# Patient Record
Sex: Female | Born: 1990 | Race: White | Hispanic: No | Marital: Married | Smoking: Never smoker
Health system: Southern US, Community
[De-identification: ages and names within clinical notes are randomized; demographics above are authoritative.]

## PROBLEM LIST (undated history)

## (undated) DIAGNOSIS — R002 Palpitations: Secondary | ICD-10-CM

## (undated) HISTORY — DX: Palpitations: R00.2

## (undated) HISTORY — PX: CHOLECYSTECTOMY: SHX55

---

## 2013-02-16 ENCOUNTER — Ambulatory Visit (INDEPENDENT_AMBULATORY_CARE_PROVIDER_SITE_OTHER): Payer: PRIVATE HEALTH INSURANCE | Admitting: General Practice

## 2013-02-16 ENCOUNTER — Encounter: Payer: Self-pay | Admitting: General Practice

## 2013-02-16 VITALS — BP 130/80 | HR 61 | Temp 98.7°F | Ht 63.75 in | Wt 182.0 lb

## 2013-02-16 DIAGNOSIS — N390 Urinary tract infection, site not specified: Secondary | ICD-10-CM

## 2013-02-16 DIAGNOSIS — M549 Dorsalgia, unspecified: Secondary | ICD-10-CM

## 2013-02-16 DIAGNOSIS — R109 Unspecified abdominal pain: Secondary | ICD-10-CM

## 2013-02-16 LAB — POCT UA - MICROSCOPIC ONLY: Casts, Ur, LPF, POC: NEGATIVE

## 2013-02-16 LAB — POCT URINALYSIS DIPSTICK
Bilirubin, UA: NEGATIVE
Glucose, UA: NEGATIVE
Nitrite, UA: NEGATIVE
Urobilinogen, UA: NEGATIVE

## 2013-02-16 LAB — POCT URINE PREGNANCY: Preg Test, Ur: NEGATIVE

## 2013-02-16 MED ORDER — CIPROFLOXACIN HCL 500 MG PO TABS
500.0000 mg | ORAL_TABLET | Freq: Two times a day (BID) | ORAL | Status: DC
Start: 2013-02-16 — End: 2013-08-16

## 2013-02-16 MED ORDER — IBUPROFEN 600 MG PO TABS: 600.0000 mg | ORAL_TABLET | Freq: Three times a day (TID) | ORAL | Status: DC | PRN

## 2013-02-16 NOTE — Progress Notes (Signed)
Subjective:    Patient ID: Erin Figueroa, female    DOB: [birth year], 22 y.o.   MRN: [id number]  Back Pain This is a new problem. The current episode started more than 1 month ago. The problem occurs intermittently. The problem has been rapidly worsening since onset. Pain location: entire back, usually starts upper back. The quality of the pain is described as aching. The pain does not radiate. The pain is at a severity of 0/10 (when having pain, rates as 10). The pain is worse during the night. Exacerbated by: walking. Pertinent negatives include no abdominal pain, bladder incontinence, bowel incontinence, chest pain, dysuria, fever, headaches, leg pain, numbness, pelvic pain or weakness. Risk factors include recent trauma. She has tried ice for the symptoms.   Reports she has an infant that is 82 months old. She reports back pain began one month after having baby. She had a c-section and reports following up with her OB. She reports they suspected a pulled muscle. Patient has not taken any muscle relaxers. She reports taking ibuprofen OTC with some relief. Reports last menstrual cycle beginning early May (unsure of date) and taking birth control as prescribed. Denies being pregnant.     Review of Systems  Constitutional: Negative for fever and chills.  HENT: Negative for neck pain and neck stiffness.   Respiratory: Negative for chest tightness and shortness of breath.   Cardiovascular: Negative for chest pain and palpitations.  Gastrointestinal: Negative for abdominal pain, blood in stool and bowel incontinence.       Abdominal pain occurs after back pain begins. Currently not experiencing abdominal pain.   Genitourinary: Negative for bladder incontinence, dysuria, vaginal bleeding and pelvic pain.  Musculoskeletal: Negative for back pain.       Currently not experiencing back pain  Neurological: Negative for weakness, numbness and headaches.       Objective:   Physical Exam   Constitutional: She is oriented to person, place, and time. She appears well-developed and well-nourished.  HENT:  Head: Normocephalic and atraumatic.  Cardiovascular: Normal rate, regular rhythm and normal heart sounds.   Pulmonary/Chest: Effort normal and breath sounds normal. No respiratory distress. She exhibits no tenderness.  Abdominal: Soft. Bowel sounds are normal. She exhibits no distension. There is no tenderness. There is no rebound and no guarding.  Neurological: She is alert and oriented to person, place, and time.  Skin: Skin is warm and dry.  Psychiatric: She has a normal mood and affect.   Results for orders placed in visit on 02/16/13  POCT URINE PREGNANCY      Result Value Range   Preg Test, Ur Negative    POCT URINALYSIS DIPSTICK      Result Value Range   Color, UA gold     Clarity, UA clear     Glucose, UA negative     Bilirubin, UA negative     Ketones, UA negative     Spec Grav, UA 1.025     Blood, UA moderate     pH, UA 6.0     Protein, UA 4+     Urobilinogen, UA negative     Nitrite, UA negative     Leukocytes, UA Trace    POCT UA - MICROSCOPIC ONLY      Result Value Range   WBC, Ur, HPF, POC 5-10     RBC, urine, microscopic 5-8     Bacteria, U Microscopic moderate     Mucus, UA negative     Epithelial  cells, urine per micros few     Crystals, Ur, HPF, POC negative     Casts, Ur, LPF, POC negative     Yeast, UA negative           Assessment & Plan:  1. Back pain and 2. Abdominal pain, unspecified site - POCT urine pregnancy - POCT urinalysis dipstick - POCT UA - Microscopic Only  3. UTI (urinary tract infection) - ciprofloxacin (CIPRO) 500 MG tablet; Take 1 tablet (500 mg total) by mouth 2 (two) times daily.  Dispense: 20 tablet; Refill: 0 -Increase fluid intake AZO over the counter X2 days Frequent voiding Proper perineal hygiene RTO if symptoms worsen or unresolved and in 2 weeks for recheck Use back up contraception (condoms) when  taking antibiotics Patient verbalized understanding Coralie Keens, FNP-C

## 2013-02-16 NOTE — Patient Instructions (Addendum)
Back Pain, Adult Low back pain is very common. About 1 in 5 people have back pain.The cause of low back pain is rarely dangerous. The pain often gets better over time.About half of people with a sudden onset of back pain feel better in just 2 weeks. About 8 in 10 people feel better by 6 weeks.  CAUSES Some common causes of back pain include:  Strain of the muscles or ligaments supporting the spine.  Wear and tear (degeneration) of the spinal discs.  Arthritis.  Direct injury to the back. DIAGNOSIS Most of the time, the direct cause of low back pain is not known.However, back pain can be treated effectively even when the exact cause of the pain is unknown.Answering your caregiver's questions about your overall health and symptoms is one of the most accurate ways to make sure the cause of your pain is not dangerous. If your caregiver needs more information, he or she may order lab work or imaging tests (X-rays or MRIs).However, even if imaging tests show changes in your back, this usually does not require surgery. HOME CARE INSTRUCTIONS For many people, back pain returns.Since low back pain is rarely dangerous, it is often a condition that people can learn to manageon their own.   Remain active. It is stressful on the back to sit or stand in one place. Do not sit, drive, or stand in one place for more than 30 minutes at a time. Take short walks on level surfaces as soon as pain allows.Try to increase the length of time you walk each day.  Do not stay in bed.Resting more than 1 or 2 days can delay your recovery.  Do not avoid exercise or work.Your body is made to move.It is not dangerous to be active, even though your back may hurt.Your back will likely heal faster if you return to being active before your pain is gone.  Pay attention to your body when you bend and lift. Many people have less discomfortwhen lifting if they bend their knees, keep the load close to their bodies,and  avoid twisting. Often, the most comfortable positions are those that put less stress on your recovering back.  Find a comfortable position to sleep. Use a firm mattress and lie on your side with your knees slightly bent. If you lie on your back, put a pillow under your knees.  Only take over-the-counter or prescription medicines as directed by your caregiver. Over-the-counter medicines to reduce pain and inflammation are often the most helpful.Your caregiver may prescribe muscle relaxant drugs.These medicines help dull your pain so you can more quickly return to your normal activities and healthy exercise.  Put ice on the injured area.  Put ice in a plastic bag.  Place a towel between your skin and the bag.  Leave the ice on for 15-20 minutes, 3-4 times a day for the first 2 to 3 days. After that, ice and heat may be alternated to reduce pain and spasms.  Ask your caregiver about trying back exercises and gentle massage. This may be of some benefit.  Avoid feeling anxious or stressed.Stress increases muscle tension and can worsen back pain.It is important to recognize when you are anxious or stressed and learn ways to manage it.Exercise is a great option. SEEK MEDICAL CARE IF:  You have pain that is not relieved with rest or medicine.  You have pain that does not improve in 1 week.  You have new symptoms.  You are generally not feeling well. SEEK   medicine.   You have pain that does not improve in 1 week.   You have new symptoms.   You are generally not feeling well.  SEEK IMMEDIATE MEDICAL CARE IF:    You have pain that radiates from your back into your legs.   You develop new bowel or bladder control problems.   You have unusual weakness or numbness in your arms or legs.   You develop nausea or vomiting.   You develop abdominal pain.   You feel faint.  Document Released: 08/25/2005 Document Revised: 02/24/2012 Document Reviewed: 01/13/2011  [employer] Patient Information 2014 [employer]  Urinary Tract Infection  Urinary tract infections (UTIs) can develop anywhere along your urinary tract. Your  urinary tract is your body's drainage system for removing wastes and extra water. Your urinary tract includes two kidneys, two ureters, a bladder, and a urethra. Your kidneys are a pair of bean-shaped organs. Each kidney is about the size of your fist. They are located below your ribs, one on each side of your spine.  CAUSES  Infections are caused by microbes, which are microscopic organisms, including fungi, viruses, and bacteria. These organisms are so small that they can only be seen through a microscope. Bacteria are the microbes that most commonly cause UTIs.  SYMPTOMS   Symptoms of UTIs may vary by age and gender of the patient and by the location of the infection. Symptoms in young women typically include a frequent and intense urge to urinate and a painful, burning feeling in the bladder or urethra during urination. Older women and men are more likely to be tired, shaky, and weak and have muscle aches and abdominal pain. A fever may mean the infection is in your kidneys. Other symptoms of a kidney infection include pain in your back or sides below the ribs, nausea, and vomiting.  DIAGNOSIS  To diagnose a UTI, your caregiver will ask you about your symptoms. Your caregiver also will ask to provide a urine sample. The urine sample will be tested for bacteria and white blood cells. White blood cells are made by your body to help fight infection.  TREATMENT   Typically, UTIs can be treated with medication. Because most UTIs are caused by a bacterial infection, they usually can be treated with the use of antibiotics. The choice of antibiotic and length of treatment depend on your symptoms and the type of bacteria causing your infection.  HOME CARE INSTRUCTIONS   If you were prescribed antibiotics, take them exactly as your caregiver instructs you. Finish the medication even if you feel better after you have only taken some of the medication.   Drink enough water and fluids to keep your urine clear or pale  yellow.   Avoid caffeine, tea, and carbonated beverages. They tend to irritate your bladder.   Empty your bladder often. Avoid holding urine for long periods of time.   Empty your bladder before and after sexual intercourse.   After a bowel movement, women should cleanse from front to back. Use each tissue only once.  SEEK MEDICAL CARE IF:    You have back pain.   You develop a fever.   Your symptoms do not begin to resolve within 3 days.  SEEK IMMEDIATE MEDICAL CARE IF:    You have severe back pain or lower abdominal pain.   You develop chills.   You have nausea or vomiting.   You have continued burning or discomfort with urination.  MAKE SURE YOU:   

## 2013-03-02 ENCOUNTER — Ambulatory Visit: Payer: PRIVATE HEALTH INSURANCE | Admitting: General Practice

## 2013-08-16 ENCOUNTER — Encounter: Payer: Self-pay | Admitting: Family Medicine

## 2013-08-16 ENCOUNTER — Ambulatory Visit (INDEPENDENT_AMBULATORY_CARE_PROVIDER_SITE_OTHER): Payer: PRIVATE HEALTH INSURANCE | Admitting: Family Medicine

## 2013-08-16 VITALS — BP 121/80 | HR 122 | Temp 99.4°F | Ht 63.75 in | Wt 189.8 lb

## 2013-08-16 DIAGNOSIS — N390 Urinary tract infection, site not specified: Secondary | ICD-10-CM

## 2013-08-16 LAB — POCT URINALYSIS DIPSTICK
Bilirubin, UA: NEGATIVE
Glucose, UA: NEGATIVE
Leukocytes, UA: NEGATIVE
Nitrite, UA: NEGATIVE
Protein, UA: 200
Spec Grav, UA: >=1.03
Urobilinogen, UA: NEGATIVE
pH, UA: 5

## 2013-08-16 LAB — POCT UA - MICROSCOPIC ONLY
Casts, Ur, LPF, POC: NEGATIVE
Crystals, Ur, HPF, POC: NEGATIVE
Yeast, UA: NEGATIVE

## 2013-08-16 MED ORDER — ONDANSETRON HCL 8 MG PO TABS: 8.0000 mg | ORAL_TABLET | Freq: Three times a day (TID) | ORAL | Status: DC | PRN

## 2013-08-16 MED ORDER — CIPROFLOXACIN HCL 500 MG PO TABS: 500.0000 mg | ORAL_TABLET | Freq: Two times a day (BID) | ORAL | Status: DC

## 2013-08-16 NOTE — Patient Instructions (Signed)

## 2013-08-16 NOTE — Progress Notes (Signed)
   Subjective:    Patient ID: Erin Figueroa, female    DOB: [birth year], 22 y.o.   MRN: [id number]  HPI This 22 y.o. female presents for evaluation of dysuria for 3 days.  She has been feeling nauseated..   Review of Systems C/o nausea and dysuria. No chest pain, SOB, HA, dizziness, vision change, diarrhea, constipation,  myalgias, arthralgias or rash.     Objective:   Physical Exam Vital signs noted  Well developed well nourished female.  HEENT - Head atraumatic Normocephalic                Eyes - PERRLA, Conjuctiva - clear Sclera- Clear EOMI                Ears - EAC's Wnl TM's Wnl Gross Hearing WNL                Throat - oropharanx wnl Respiratory - Lungs CTA bilateral Cardiac - RRR S1 and S2 without murmur GI - Abdomen soft Nontender and bowel sounds active x 4 Extremities - No edema. Neuro - Grossly intact.       Assessment & Plan:  UTI (urinary tract infection) - Plan: POCT UA - Microscopic Only, POCT urinalysis dipstick, ciprofloxacin (CIPRO) 500 MG tablet, ondansetron (ZOFRAN) 8 MG tablet  Deatra Canter FNP

## 2013-12-24 ENCOUNTER — Other Ambulatory Visit: Payer: Self-pay | Admitting: Family Medicine

## 2013-12-24 ENCOUNTER — Ambulatory Visit (INDEPENDENT_AMBULATORY_CARE_PROVIDER_SITE_OTHER): Payer: PRIVATE HEALTH INSURANCE | Admitting: Family Medicine

## 2013-12-24 VITALS — BP 131/79 | HR 76 | Temp 97.7°F

## 2013-12-24 DIAGNOSIS — N390 Urinary tract infection, site not specified: Secondary | ICD-10-CM

## 2013-12-24 DIAGNOSIS — R300 Dysuria: Secondary | ICD-10-CM

## 2013-12-24 LAB — POCT UA - MICROSCOPIC ONLY

## 2013-12-24 LAB — POCT URINALYSIS DIPSTICK
BILIRUBIN UA: NEGATIVE
Glucose, UA: NEGATIVE
Ketones, UA: NEGATIVE
LEUKOCYTES UA: NEGATIVE
NITRITE UA: NEGATIVE
Spec Grav, UA: >=1.03
UROBILINOGEN UA: 0.2
pH, UA: 6.5

## 2013-12-24 MED ORDER — CIPROFLOXACIN HCL 500 MG PO TABS: 500.0000 mg | ORAL_TABLET | Freq: Two times a day (BID) | ORAL | Status: DC

## 2013-12-24 MED ORDER — PHENAZOPYRIDINE HCL 200 MG PO TABS: 200.0000 mg | ORAL_TABLET | Freq: Three times a day (TID) | ORAL | Status: DC | PRN

## 2013-12-24 NOTE — Patient Instructions (Signed)
Drink plenty of fluids Continue to take Cipro 500 twice daily for at least 7 days If culture and sensitivity indicates that another antibiotic is needed we will let you know when that result returns Take Pyridium for discomfort with voiding

## 2013-12-24 NOTE — Addendum Note (Signed)
Addended by: Bernita BuffyANDERSON, Velda Wendt G on: 12/24/2013 09:24 AM   Modules accepted: Orders

## 2013-12-24 NOTE — Progress Notes (Signed)
Subjective:    Patient ID: Erin Figueroa, female    DOB: 03/25/1991, 23 y.o.   MRN: [id number]  Hematuria Irritative symptoms do not include frequency or urgency. Associated symptoms include abdominal pain, dysuria and nausea. Pertinent negatives include no flank pain or vomiting.    Patient is here for burning with urination since last night. Also saw some blood in her urine. Took 1 Cipro last night that she had left over from an old rx. The patient indicates that all of her she has had urinary tract infections and she has never seen a specialist. We will arrange for her to see a urologist for further evaluation of these recurrent urinary tract infections.  Review of Systems  Constitutional: Negative.   HENT: Negative.   Eyes: Negative.   Respiratory: Negative.   Cardiovascular: Negative.   Gastrointestinal: Positive for nausea and abdominal pain. Negative for vomiting, diarrhea, constipation, blood in stool, abdominal distention, anal bleeding and rectal pain.  Endocrine: Negative.   Genitourinary: Positive for dysuria, hematuria and difficulty urinating. Negative for urgency, frequency, flank pain, decreased urine volume, vaginal bleeding, vaginal discharge, enuresis, genital sores, vaginal pain, menstrual problem, pelvic pain and dyspareunia.  Musculoskeletal: Positive for back pain. Negative for arthralgias, gait problem, joint swelling, myalgias, neck pain and neck stiffness.  Skin: Negative.   Allergic/Immunologic: Negative.   Neurological: Negative.   Hematological: Negative.   Psychiatric/Behavioral: Negative.        Objective:   Physical Exam  Nursing note and vitals reviewed. Constitutional: She is oriented to person, place, and time. She appears well-developed and well-nourished. No distress.  HENT:  Head: Normocephalic.  Eyes: Conjunctivae and EOM are normal. Pupils are equal, round, and reactive to light. Right eye exhibits no discharge. Left eye exhibits no  discharge. No scleral icterus.  Neck: Normal range of motion.  Pulmonary/Chest: Breath sounds normal.  Abdominal: Soft. Bowel sounds are normal. She exhibits no distension and no mass. There is tenderness. There is no rebound and no guarding.  Slight suprapubic tenderness  Musculoskeletal: Normal range of motion. She exhibits no edema.  Lymphadenopathy:    She has no cervical adenopathy.  Neurological: She is alert and oriented to person, place, and time.  Skin: Skin is warm and dry. No rash noted.  Psychiatric: She has a normal mood and affect. Her behavior is normal. Judgment and thought content normal.   The urinalysis had 16-40 WBCs and many RBCs and a lot of bacteria.  BP 131/79  Pulse 76  Temp(Src) 97.7 F (36.5 C) (Oral)     Assessment & Plan:   1. Dysuria - POCT UA - Microscopic Only - POCT urinalysis dipstick - phenazopyridine (PYRIDIUM) 200 MG tablet; Take 1 tablet (200 mg total) by mouth 3 (three) times daily as needed for pain.  Dispense: 10 tablet; Refill: 0  2. UTI (urinary tract infection) - ciprofloxacin (CIPRO) 500 MG tablet; Take 1 tablet (500 mg total) by mouth 2 (two) times daily.  Dispense: 20 tablet; Refill: 0 - phenazopyridine (PYRIDIUM) 200 MG tablet; Take 1 tablet (200 mg total) by mouth 3 (three) times daily as needed for pain.  Dispense: 10 tablet; Refill: 0  3. Recurrent UTI -Referral to urology  Patient Instructions  Drink plenty of fluids Continue to take Cipro 500 twice daily for at least 7 days If culture and sensitivity indicates that another antibiotic is needed we will let you know when that result returns Take Pyridium for discomfort with voiding   Nyra Capeson W. Feras Gardella  MD

## 2013-12-26 ENCOUNTER — Other Ambulatory Visit: Payer: Self-pay | Admitting: Family Medicine

## 2013-12-26 DIAGNOSIS — N390 Urinary tract infection, site not specified: Secondary | ICD-10-CM

## 2013-12-28 LAB — SPECIMEN STATUS REPORT

## 2013-12-28 LAB — URINE CULTURE

## 2014-05-16 ENCOUNTER — Telehealth: Payer: Self-pay | Admitting: Family Medicine

## 2014-05-16 NOTE — Telephone Encounter (Signed)
Appt given for Thursday am per patient request. Offered appt for today but patient refused

## 2014-05-18 ENCOUNTER — Ambulatory Visit (INDEPENDENT_AMBULATORY_CARE_PROVIDER_SITE_OTHER): Payer: PRIVATE HEALTH INSURANCE

## 2014-05-18 ENCOUNTER — Encounter: Payer: Self-pay | Admitting: Family Medicine

## 2014-05-18 ENCOUNTER — Ambulatory Visit (INDEPENDENT_AMBULATORY_CARE_PROVIDER_SITE_OTHER): Payer: PRIVATE HEALTH INSURANCE | Admitting: Family Medicine

## 2014-05-18 VITALS — BP 107/74 | HR 72 | Temp 98.3°F | Ht 63.75 in | Wt 211.0 lb

## 2014-05-18 DIAGNOSIS — N912 Amenorrhea, unspecified: Secondary | ICD-10-CM

## 2014-05-18 DIAGNOSIS — M79609 Pain in unspecified limb: Secondary | ICD-10-CM

## 2014-05-18 DIAGNOSIS — L02818 Cutaneous abscess of other sites: Secondary | ICD-10-CM

## 2014-05-18 DIAGNOSIS — K141 Geographic tongue: Secondary | ICD-10-CM

## 2014-05-18 DIAGNOSIS — L03818 Cellulitis of other sites: Secondary | ICD-10-CM

## 2014-05-18 DIAGNOSIS — M79671 Pain in right foot: Secondary | ICD-10-CM

## 2014-05-18 LAB — POCT URINE PREGNANCY: Preg Test, Ur: NEGATIVE

## 2014-05-18 MED ORDER — NAPROXEN 500 MG PO TABS: 500.0000 mg | ORAL_TABLET | Freq: Two times a day (BID) | ORAL | Status: DC

## 2014-05-18 MED ORDER — MAGIC MOUTHWASH: 5.0000 mL | Freq: Four times a day (QID) | ORAL | Status: DC | PRN

## 2014-05-18 MED ORDER — DOXYCYCLINE HYCLATE 100 MG PO TABS: 100.0000 mg | ORAL_TABLET | Freq: Two times a day (BID) | ORAL | Status: DC

## 2014-05-18 NOTE — Progress Notes (Signed)
   Subjective:    Patient ID: Erin Figueroa, female    DOB: Jun 03, 1991, 23 y.o.   MRN: [id number]  HPI Patient is here with c/o right foot swelling.  She dropped object on right foot 4 weeks ago. She is having persistent swelling.  She has a sore tongue.  She has not had a period for over 2 months   Review of Systems No chest pain, SOB, HA, dizziness, vision change, N/V, diarrhea, constipation, dysuria, urinary urgency or frequency, myalgias, arthralgias or rash.     Objective:   Physical Exam  Vital signs noted  Well developed well nourished female.  HEENT - Head atraumatic Normocephalic                Eyes - PERRLA, Conjuctiva - clear Sclera- Clear EOMI                Ears - EAC's Wnl TM's Wnl Gross Hearing WNL                 Throat - oropharanx with geographic tongue. Respiratory - Lungs CTA bilateral Cardiac - RRR S1 and S2 without murmur GI - Abdomen soft Nontender and bowel sounds active x 4 Extremities - right foot with pedal edema Neuro - Grossly intact. Skin - right first toe erythematous     Assessment & Plan:  Amenorrhea - Plan: POCT urine pregnancy negative and recommend follow up with PCP to discuss.  Right foot pain - Plan: DG Foot Complete Right, doxycycline (VIBRA-TABS) 100 MG tablet, naproxen (NAPROSYN) 500 MG tablet  Glossitis, benign migratory - Plan: Alum & Mag Hydroxide-Simeth (MAGIC MOUTHWASH) SOLN  Cellulitis of other specified site - Plan: doxycycline (VIBRA-TABS) 100 MG tablet  Deatra Canter FNP

## 2014-05-19 ENCOUNTER — Telehealth: Payer: Self-pay | Admitting: Family Medicine

## 2014-05-19 MED ORDER — AMOXICILLIN 875 MG PO TABS: 875.0000 mg | ORAL_TABLET | Freq: Two times a day (BID) | ORAL | Status: DC

## 2014-05-19 NOTE — Telephone Encounter (Signed)
Changed antibiotic to amoxicillin.

## 2014-05-19 NOTE — Telephone Encounter (Signed)
Can you please review 

## 2014-05-22 NOTE — Telephone Encounter (Signed)
Left message that medication had been changed.

## 2014-09-23 ENCOUNTER — Emergency Department (HOSPITAL_COMMUNITY): Payer: PRIVATE HEALTH INSURANCE

## 2014-09-23 ENCOUNTER — Inpatient Hospital Stay (HOSPITAL_COMMUNITY)
Admission: EM | Admit: 2014-09-23 | Discharge: 2014-09-25 | DRG: 417 | Disposition: A | Discharge status: Home / Self Care | Payer: PRIVATE HEALTH INSURANCE | Attending: Surgery | Admitting: Surgery

## 2014-09-23 ENCOUNTER — Encounter (HOSPITAL_COMMUNITY): Payer: Self-pay | Admitting: Emergency Medicine

## 2014-09-23 DIAGNOSIS — R1011 Right upper quadrant pain: Secondary | ICD-10-CM | POA: Diagnosis present

## 2014-09-23 DIAGNOSIS — Z881 Allergy status to other antibiotic agents status: Secondary | ICD-10-CM

## 2014-09-23 DIAGNOSIS — Z87440 Personal history of urinary (tract) infections: Secondary | ICD-10-CM

## 2014-09-23 DIAGNOSIS — Z833 Family history of diabetes mellitus: Secondary | ICD-10-CM | POA: Diagnosis not present

## 2014-09-23 DIAGNOSIS — Z8249 Family history of ischemic heart disease and other diseases of the circulatory system: Secondary | ICD-10-CM

## 2014-09-23 DIAGNOSIS — R748 Abnormal levels of other serum enzymes: Secondary | ICD-10-CM | POA: Diagnosis present

## 2014-09-23 DIAGNOSIS — K8060 Calculus of gallbladder and bile duct with cholecystitis, unspecified, without obstruction: Principal | ICD-10-CM | POA: Diagnosis present

## 2014-09-23 DIAGNOSIS — Z6838 Body mass index (BMI) 38.0-38.9, adult: Secondary | ICD-10-CM | POA: Diagnosis not present

## 2014-09-23 DIAGNOSIS — K859 Acute pancreatitis, unspecified: Secondary | ICD-10-CM | POA: Diagnosis present

## 2014-09-23 DIAGNOSIS — Z79899 Other long term (current) drug therapy: Secondary | ICD-10-CM

## 2014-09-23 DIAGNOSIS — Z791 Long term (current) use of non-steroidal anti-inflammatories (NSAID): Secondary | ICD-10-CM

## 2014-09-23 DIAGNOSIS — K8050 Calculus of bile duct without cholangitis or cholecystitis without obstruction: Secondary | ICD-10-CM

## 2014-09-23 DIAGNOSIS — K8020 Calculus of gallbladder without cholecystitis without obstruction: Secondary | ICD-10-CM

## 2014-09-23 DIAGNOSIS — K829 Disease of gallbladder, unspecified: Secondary | ICD-10-CM | POA: Diagnosis present

## 2014-09-23 LAB — CBC WITH DIFFERENTIAL/PLATELET
BASOS ABS: 0 10*3/uL (ref 0.0–0.1)
BASOS PCT: 0 % (ref 0–1)
EOS ABS: 0.1 10*3/uL (ref 0.0–0.7)
Eosinophils Relative: 1 % (ref 0–5)
HEMATOCRIT: 42.5 % (ref 36.0–46.0)
HEMOGLOBIN: 14.2 g/dL (ref 12.0–15.0)
LYMPHS PCT: 17 % (ref 12–46)
Lymphs Abs: 1.6 10*3/uL (ref 0.7–4.0)
MCH: 28.5 pg (ref 26.0–34.0)
MCHC: 33.4 g/dL (ref 30.0–36.0)
MCV: 85.3 fL (ref 78.0–100.0)
MONOS PCT: 5 % (ref 3–12)
Monocytes Absolute: 0.5 10*3/uL (ref 0.1–1.0)
NEUTROS ABS: 6.8 10*3/uL (ref 1.7–7.7)
NEUTROS PCT: 77 % (ref 43–77)
Platelets: 273 10*3/uL (ref 150–400)
RBC: 4.98 MIL/uL (ref 3.87–5.11)
RDW: 13.1 % (ref 11.5–15.5)
WBC: 9 10*3/uL (ref 4.0–10.5)

## 2014-09-23 LAB — COMPREHENSIVE METABOLIC PANEL
ALBUMIN: 4.6 g/dL (ref 3.5–5.2)
ALT: 360 U/L — ABNORMAL HIGH (ref 0–35)
AST: 179 U/L — AB (ref 0–37)
Alkaline Phosphatase: 224 U/L — ABNORMAL HIGH (ref 39–117)
Anion gap: 6 (ref 5–15)
BILIRUBIN TOTAL: 1 mg/dL (ref 0.3–1.2)
BUN: 14 mg/dL (ref 6–23)
CHLORIDE: 105 meq/L (ref 96–112)
CO2: 25 mmol/L (ref 19–32)
Calcium: 9.2 mg/dL (ref 8.4–10.5)
Creatinine, Ser: 0.76 mg/dL (ref 0.50–1.10)
GFR calc Af Amer: >90 mL/min (ref >90)
GFR calc non Af Amer: >90 mL/min (ref >90)
GLUCOSE: 90 mg/dL (ref 70–99)
POTASSIUM: 3.7 mmol/L (ref 3.5–5.1)
SODIUM: 136 mmol/L (ref 135–145)
Total Protein: 8.4 g/dL — ABNORMAL HIGH (ref 6.0–8.3)

## 2014-09-23 LAB — URINALYSIS, ROUTINE W REFLEX MICROSCOPIC
GLUCOSE, UA: NEGATIVE mg/dL
KETONES UR: NEGATIVE mg/dL
NITRITE: NEGATIVE
PH: 8 (ref 5.0–8.0)
PROTEIN: NEGATIVE mg/dL
Specific Gravity, Urine: 1.022 (ref 1.005–1.030)
Urobilinogen, UA: 1 mg/dL (ref 0.0–1.0)

## 2014-09-23 LAB — URINE MICROSCOPIC-ADD ON

## 2014-09-23 LAB — LIPASE, BLOOD: Lipase: 117 U/L — ABNORMAL HIGH (ref 11–59)

## 2014-09-23 LAB — POC URINE PREG, ED: Preg Test, Ur: NEGATIVE

## 2014-09-23 MED ORDER — ONDANSETRON 8 MG PO TBDP
8.0000 mg | ORAL_TABLET | Freq: Once | ORAL | Status: AC
  Administered 2014-09-23: 8 mg via ORAL
  Filled 2014-09-23: qty 1

## 2014-09-23 MED ORDER — ONDANSETRON HCL 4 MG/2ML IJ SOLN
4.0000 mg | Freq: Four times a day (QID) | INTRAMUSCULAR | Status: DC | PRN
  Administered 2014-09-23: 4 mg via INTRAVENOUS
  Filled 2014-09-23: qty 2

## 2014-09-23 MED ORDER — POTASSIUM CHLORIDE IN NACL 20-0.45 MEQ/L-% IV SOLN
INTRAVENOUS | Status: DC
  Administered 2014-09-23: 150 mL/h via INTRAVENOUS
  Administered 2014-09-24 (×2): via INTRAVENOUS
  Administered 2014-09-24: 150 mL/h via INTRAVENOUS
  Administered 2014-09-25 (×2): via INTRAVENOUS
  Filled 2014-09-23 (×9): qty 1000

## 2014-09-23 MED ORDER — HYDROMORPHONE HCL 1 MG/ML IJ SOLN
0.5000 mg | INTRAMUSCULAR | Status: DC | PRN
  Administered 2014-09-23 (×2): 0.5 mg via INTRAVENOUS
  Filled 2014-09-23 (×2): qty 1

## 2014-09-23 MED ORDER — PROMETHAZINE HCL 25 MG/ML IJ SOLN
25.0000 mg | Freq: Four times a day (QID) | INTRAMUSCULAR | Status: DC | PRN
  Administered 2014-09-23 – 2014-09-25 (×2): 25 mg via INTRAVENOUS
  Filled 2014-09-23 (×2): qty 1

## 2014-09-23 MED ORDER — MORPHINE SULFATE 2 MG/ML IJ SOLN: 1.0000 mg | INTRAMUSCULAR | Status: DC | PRN

## 2014-09-23 MED ORDER — HYDROCODONE-ACETAMINOPHEN 5-325 MG PO TABS
1.0000 | ORAL_TABLET | ORAL | Status: DC | PRN
  Administered 2014-09-23 – 2014-09-25 (×6): 2 via ORAL
  Filled 2014-09-23 (×6): qty 2

## 2014-09-23 MED ORDER — ONDANSETRON HCL 4 MG/2ML IJ SOLN
4.0000 mg | Freq: Once | INTRAMUSCULAR | Status: AC
  Administered 2014-09-23: 4 mg via INTRAVENOUS
  Filled 2014-09-23: qty 2

## 2014-09-23 MED ORDER — SODIUM CHLORIDE 0.9 % IV SOLN
1000.0000 mL | Freq: Once | INTRAVENOUS | Status: AC
  Administered 2014-09-23: 1000 mL via INTRAVENOUS

## 2014-09-23 MED ORDER — SODIUM CHLORIDE 0.9 % IV SOLN
1000.0000 mL | INTRAVENOUS | Status: DC
  Administered 2014-09-23: 1000 mL via INTRAVENOUS

## 2014-09-23 NOTE — ED Notes (Signed)
Pt reports recent UTI but self treated with CIPRO leading to allergic reaction. Pt seen yesterday for allergic reaction and given prednisone.

## 2014-09-23 NOTE — ED Notes (Signed)
Ultrasound at bedside

## 2014-09-23 NOTE — ED Provider Notes (Signed)
CSN: [id number]     Arrival date & time 09/23/14  0915 History   First MD Initiated Contact with Patient 09/23/14 1024     Chief Complaint  Patient presents with  . Flank Pain  . Emesis   HPI Patient presents to the emergency room with complaints of right flank pain. Patient started having trouble Thursday evening. Her mean consisted of chick fil a nuggets and fries. She had a urine sample done in the office that showed blood. This was done by a family member who works in the [employer] office. The patient has had some nausea as well. She had an episode of vomiting this morning and decided to come to the emergency room. The pain is in the right flank. It does not radiate. It is sharp. She has not noticed anything particularly makes it worse or better. No history of problems with gallstones or kidney stones in the past. History reviewed. No pertinent past medical history. Past Surgical History  Procedure Laterality Date  . Cesarean section     Family History  Problem Relation Age of Onset  . Depression Mother   . Diabetes Father   . Hypertension Father    History  Substance Use Topics  . Smoking status: Never Smoker   . Smokeless tobacco: Not on file  . Alcohol Use: No   OB History    No data available     Review of Systems  All other systems reviewed and are negative.     Allergies  Ciprofloxacin and Sulfa antibiotics  Home Medications   Prior to Admission medications   Medication Sig Start Date End Date Taking? Authorizing Provider  ibuprofen (ADVIL,MOTRIN) 200 MG tablet Take 400 mg by mouth every 6 (six) hours as needed for mild pain.   Yes Historical Provider, MD  Alum & Mag Hydroxide-Simeth (MAGIC MOUTHWASH) SOLN Take 5 mLs by mouth 4 (four) times daily as needed for mouth pain. Patient not taking: Reported on 09/23/2014 05/18/14   Deatra CanterWilliam J Oxford, FNP  amoxicillin (AMOXIL) 875 MG tablet Take 1 tablet (875 mg total) by mouth 2 (two) times daily. Patient not taking:  Reported on 09/23/2014 05/19/14   Mary-Margaret Daphine DeutscherMartin, FNP  naproxen (NAPROSYN) 500 MG tablet Take 1 tablet (500 mg total) by mouth 2 (two) times daily with a meal. Patient not taking: Reported on 09/23/2014 05/18/14   Deatra CanterWilliam J Oxford, FNP   BP 111/63 mmHg  Pulse 63  Temp(Src) 98.4 F (36.9 C) (Oral)  Resp 16  SpO2 99%  LMP  Physical Exam  Constitutional: She appears well-developed and well-nourished. No distress.  Obese   HENT:  Head: Normocephalic and atraumatic.  Right Ear: External ear normal.  Left Ear: External ear normal.  Eyes: Conjunctivae are normal. Right eye exhibits no discharge. Left eye exhibits no discharge. No scleral icterus.  Neck: Neck supple. No tracheal deviation present.  Cardiovascular: Normal rate, regular rhythm and intact distal pulses.   Pulmonary/Chest: Effort normal and breath sounds normal. No stridor. No respiratory distress. She has no wheezes. She has no rales.  Abdominal: Soft. Bowel sounds are normal. She exhibits no distension. There is tenderness in the right upper quadrant. There is CVA tenderness (right). There is no rigidity, no rebound and no guarding. No hernia.  Musculoskeletal: She exhibits no edema or tenderness.  Neurological: She is alert. She has normal strength. No cranial nerve deficit (no facial droop, extraocular movements intact, no slurred speech) or sensory deficit. She exhibits normal muscle tone. She displays  no seizure activity. Coordination normal.  Skin: Skin is warm and dry. No rash noted.  Psychiatric: She has a normal mood and affect.  Nursing note and vitals reviewed.   ED Course  Procedures (including critical care time) Labs Review Labs Reviewed  COMPREHENSIVE METABOLIC PANEL - Abnormal; Notable for the following:    Total Protein 8.4 (*)    AST 179 (*)    ALT 360 (*)    Alkaline Phosphatase 224 (*)    All other components within normal limits  LIPASE, BLOOD - Abnormal; Notable for the following:    Lipase 117  (*)    All other components within normal limits  URINALYSIS, ROUTINE W REFLEX MICROSCOPIC - Abnormal; Notable for the following:    Color, Urine AMBER (*)    APPearance TURBID (*)    Hgb urine dipstick TRACE (*)    Bilirubin Urine SMALL (*)    Leukocytes, UA TRACE (*)    All other components within normal limits  URINE MICROSCOPIC-ADD ON - Abnormal; Notable for the following:    Bacteria, UA FEW (*)    All other components within normal limits  CBC WITH DIFFERENTIAL  POC URINE PREG, ED    Imaging Review US Abdomen Complete  09/23/2014   CLINICAL DATA:  Initial encounter for right upper quadrant pain for 3 days of vomiting.  EXAM: ULTRASOUND ABDOMEN COMPLETE  COMPARISON:  None.  FINDINGS: Gallbladder: Gallstones, measuring up to 9 mm. No wall thickening or pericholecystic fluid. Sonographic Murphy's sign was not elicited.  Common bile duct: Diameter: Dilated for age, 8 mm. Upper normal 5-6 mm. No evidence of choledocholithiasis.  Liver: No focal lesion identified. Within normal limits in parenchymal echogenicity.  IVC: No abnormality visualized.  Pancreas: Visualized portion unremarkable.  Spleen: Size and appearance within normal limits.  Right Kidney: Length: 11.6 cm. Echogenicity within normal limits. No mass or hydronephrosis visualized.  Left Kidney: Length: 10.9 cm. Echogenicity within normal limits. No mass or hydronephrosis visualized.  Abdominal aorta: No aneurysm visualized.  Other findings: No ascites.  IMPRESSION: 1. Cholelithiasis without acute cholecystitis. 2. Mild common duct dilatation for age. Given normal bilirubin levels, this is of questionable significance. Further imaging evaluation (especially given elevated lipase) with CT or MRCP may be informative.   Electronically Signed   By: Jeronimo Greaves M.D.   On: 09/23/2014 12:27   Medications  0.9 %  sodium chloride infusion (0 mLs Intravenous Stopped 09/23/14 1224)    Followed by  0.9 %  sodium chloride infusion (1,000 mLs  Intravenous New Bag/Given 09/23/14 1100)  HYDROmorphone (DILAUDID) injection 0.5 mg (0.5 mg Intravenous Given 09/23/14 1352)  ondansetron (ZOFRAN-ODT) disintegrating tablet 8 mg (8 mg Oral Given 09/23/14 0944)  ondansetron (ZOFRAN) injection 4 mg (4 mg Intravenous Given 09/23/14 1101)     MDM   Final diagnoses:  RUQ pain  Calculus of gallbladder without cholecystitis without obstruction   Pt has persistent pain despite treatment in the ED.  Symptoms related to her biliary colic.  Does have slight increase in LFTs and lipase although no definite choledocholithiasis.  Discussed with Dr Ezzard Standing as patient continues to have pain in her back and ruq.  Plan on admission for cholecystectomy     Linwood Dibbles, MD 09/23/14 1422

## 2014-09-23 NOTE — H&P (Signed)
Re:   Erin Figueroa DOB:   01/20/1991 MRN:   [id number]   [facility] admission note  ASSESSMENT AND PLAN: 1.  Cholecystitis/cholelithiasis  I discussed with the patient the indications and risks of gall bladder surgery.  The primary risks of gall bladder surgery include, but are not limited to, bleeding, infection, common bile duct injury, and open surgery.  There is also the risk that the patient may have continued symptoms after surgery.  However, the likelihood of improvement in symptoms and return to the patient's normal status is good. We discussed the typical post-operative recovery course. I tried to answer the patient's questions.  2.  Possible mild pancreatits  Recheck lipase in AM 3.  Obese  Chief Complaint  Patient presents with  . Flank Pain  . Emesis   REFERRING PHYSICIAN:   Dr. Dorie Rank, [facility]  HISTORY OF PRESENT ILLNESS: Erin Figueroa is a 24 y.o. (DOB: 13-Mar-1991)  white  female whose primary care physician is Redge Gainer, MD and comes to [facility] ER today for abdominal pain. Her MIL, Erin Figueroa, is at the bedside.  She has no prior GI history and no one in her family has had gall stones.  She had a recent UTI.  She treated herself with Cipro, but had an allergic reaction.  She was seen by Dr. Isabel Caprice.  Then Thursday, 09/21/2014, she developed back pain and N/V.  Because of persistent symptoms - she came to the [facility].  Her only prior abdominal surgery was a C section 2 years ago.  She's had no fever.  [place] of abdomen - 09/23/2014 - 1. Cholelithiasis without acute cholecystitis.  2. Mild common duct dilatation for age. Given normal bilirubin levels, this is of questionable significance. WBC - 9,000 - 09/23/2014 T. Bili - 1.0, Alk phos - 224, ALT - 360, AST - 179 - 09/23/2014 Mildly elevated lipase - 117 - 09/23/2014   History reviewed. No pertinent past medical history.    Past Surgical History  Procedure Laterality Date  . Cesarean section        Current  Facility-Administered Medications  Medication Dose Route Frequency Provider Last Rate Last Dose  . 0.9 %  sodium chloride infusion  1,000 mL Intravenous Continuous Dorie Rank, MD 125 mL/hr at 09/23/14 1100 1,000 mL at 09/23/14 1100  . HYDROmorphone (DILAUDID) injection 0.5 mg  0.5 mg Intravenous Q30 min PRN Dorie Rank, MD   0.5 mg at 09/23/14 1352   Current Outpatient Prescriptions  Medication Sig Dispense Refill  . ibuprofen (ADVIL,MOTRIN) 200 MG tablet Take 400 mg by mouth every 6 (six) hours as needed for mild pain.    Marland Kitchen Alum & Mag Hydroxide-Simeth (MAGIC MOUTHWASH) SOLN Take 5 mLs by mouth 4 (four) times daily as needed for mouth pain. (Patient not taking: Reported on 09/23/2014) 240 mL 0  . amoxicillin (AMOXIL) 875 MG tablet Take 1 tablet (875 mg total) by mouth 2 (two) times daily. (Patient not taking: Reported on 09/23/2014) 20 tablet 0  . naproxen (NAPROSYN) 500 MG tablet Take 1 tablet (500 mg total) by mouth 2 (two) times daily with a meal. (Patient not taking: Reported on 09/23/2014) 20 tablet 0      Allergies  Allergen Reactions  . Ciprofloxacin     itching  . Sulfa Antibiotics Nausea Only    REVIEW OF SYSTEMS: Skin:  No history of rash.  No history of abnormal moles. Infection:  No history of hepatitis or HIV.  No history of MRSA. Neurologic:  No history of stroke.  No history of seizure.  No history of headaches. Cardiac:  No history of hypertension. No history of heart disease.  No history of prior cardiac catheterization.  No history of seeing a cardiologist. Pulmonary:  Does not smoke cigarettes.  No asthma or bronchitis.  No OSA/CPAP.  Endocrine:  No diabetes. No thyroid disease. Gastrointestinal:  No history of stomach disease.  No history of liver disease.  No history of gall bladder disease.  No history of pancreas disease.  No history of colon disease. Urologic:  History of UTI's.  Has been seen at [facility], but does not remember the name of the physician she  saaw. Musculoskeletal:  No history of joint or back disease. Hematologic:  No bleeding disorder.  No history of anemia.  Not anticoagulated. Psycho-social:  The patient is oriented.   The patient has no obvious psychologic or social impairment to understanding our conversation and plan.  SOCIAL and FAMILY HISTORY: Married. Husband at home.  MIL at bedside - Laiken Sandy Has one 56 yo son. 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  PHYSICAL EXAM: BP 111/63 mmHg  Pulse 63  Temp(Src) 98.4 F (36.9 C) (Oral)  Resp 16  SpO2 99%  LMP   General: Obese wF who is alert and generally healthy appearing.   But she does not look like she feels okay. HEENT: Normal. Pupils equal. Neck: Supple. No mass.  No thyroid mass. Lymph Nodes:  No supraclavicular or cervical nodes. Lungs: Clear to auscultation and symmetric breath sounds. Heart:  RRR. No murmur or rub. Abdomen: Soft. No mass. No tenderness. No hernia. Normal bowel sounds.    Rectal: Not done. Extremities:  Good strength and ROM  in upper and lower extremities. Neurologic:  Grossly intact to motor and sensory function. Psychiatric: Has normal mood and affect. Behavior is normal.   DATA REVIEWED: Epic data  Alphonsa Overall, MD,  [facility], [place]  [place], [place]    [place] Phone:  [place]:  [phone]

## 2014-09-23 NOTE — ED Notes (Signed)
Pt from home c/o right flank pain since yesterday. Recent Dx of UTI and was treated. UA performed in office yesterday that showed blood but no bacteria. Emesis since yesterday with 2 episodes in last 24 hours.

## 2014-09-24 ENCOUNTER — Encounter (HOSPITAL_COMMUNITY): Admission: EM | Disposition: A | Discharge status: Home / Self Care | Payer: Self-pay | Source: Home / Self Care

## 2014-09-24 ENCOUNTER — Inpatient Hospital Stay (HOSPITAL_COMMUNITY): Payer: PRIVATE HEALTH INSURANCE | Admitting: Certified Registered Nurse Anesthetist

## 2014-09-24 ENCOUNTER — Inpatient Hospital Stay (HOSPITAL_COMMUNITY): Payer: PRIVATE HEALTH INSURANCE

## 2014-09-24 HISTORY — PX: CHOLECYSTECTOMY: SHX55

## 2014-09-24 LAB — COMPREHENSIVE METABOLIC PANEL
ALBUMIN: 4.4 g/dL (ref 3.5–5.2)
ALT: 293 U/L — ABNORMAL HIGH (ref 0–35)
AST: 115 U/L — ABNORMAL HIGH (ref 0–37)
Alkaline Phosphatase: 217 U/L — ABNORMAL HIGH (ref 39–117)
Anion gap: 11 (ref 5–15)
BUN: 9 mg/dL (ref 6–23)
CO2: 23 mmol/L (ref 19–32)
Calcium: 9.1 mg/dL (ref 8.4–10.5)
Chloride: 101 mEq/L (ref 96–112)
Creatinine, Ser: 0.75 mg/dL (ref 0.50–1.10)
GFR calc Af Amer: >90 mL/min (ref >90)
GLUCOSE: 71 mg/dL (ref 70–99)
Potassium: 4.6 mmol/L (ref 3.5–5.1)
Sodium: 135 mmol/L (ref 135–145)
Total Bilirubin: 1.7 mg/dL — ABNORMAL HIGH (ref 0.3–1.2)
Total Protein: 8 g/dL (ref 6.0–8.3)

## 2014-09-24 LAB — CBC
HCT: 43.3 % (ref 36.0–46.0)
Hemoglobin: 14.1 g/dL (ref 12.0–15.0)
MCH: 28.5 pg (ref 26.0–34.0)
MCHC: 32.6 g/dL (ref 30.0–36.0)
MCV: 87.5 fL (ref 78.0–100.0)
Platelets: 275 10*3/uL (ref 150–400)
RBC: 4.95 MIL/uL (ref 3.87–5.11)
RDW: 13.2 % (ref 11.5–15.5)
WBC: 9.2 10*3/uL (ref 4.0–10.5)

## 2014-09-24 LAB — LIPASE, BLOOD: LIPASE: 30 U/L (ref 11–59)

## 2014-09-24 SURGERY — LAPAROSCOPIC CHOLECYSTECTOMY WITH INTRAOPERATIVE CHOLANGIOGRAM
Anesthesia: General | Site: Abdomen

## 2014-09-24 MED ORDER — BUPIVACAINE-EPINEPHRINE 0.25% -1:200000 IJ SOLN
INTRAMUSCULAR | Status: DC | PRN
  Administered 2014-09-24: 25 mL

## 2014-09-24 MED ORDER — NEOSTIGMINE METHYLSULFATE 10 MG/10ML IV SOLN
INTRAVENOUS | Status: DC | PRN
  Administered 2014-09-24: 4 mg via INTRAVENOUS

## 2014-09-24 MED ORDER — MORPHINE SULFATE 2 MG/ML IJ SOLN
1.0000 mg | INTRAMUSCULAR | Status: DC | PRN
  Administered 2014-09-24 (×2): 2 mg via INTRAVENOUS

## 2014-09-24 MED ORDER — PROPOFOL 10 MG/ML IV BOLUS
INTRAVENOUS | Status: DC | PRN
  Administered 2014-09-24: 200 mg via INTRAVENOUS

## 2014-09-24 MED ORDER — SODIUM CHLORIDE 0.9 % IJ SOLN
INTRAMUSCULAR | Status: AC
  Administered 2014-09-24: 10 mL
  Filled 2014-09-24: qty 10

## 2014-09-24 MED ORDER — SUCCINYLCHOLINE CHLORIDE 20 MG/ML IJ SOLN
INTRAMUSCULAR | Status: DC | PRN
  Administered 2014-09-24: 100 mg via INTRAVENOUS

## 2014-09-24 MED ORDER — ROCURONIUM BROMIDE 100 MG/10ML IV SOLN
INTRAVENOUS | Status: DC | PRN
  Administered 2014-09-24: 25 mg via INTRAVENOUS

## 2014-09-24 MED ORDER — SODIUM CHLORIDE 0.9 % IJ SOLN
INTRAMUSCULAR | Status: AC
  Filled 2014-09-24: qty 10

## 2014-09-24 MED ORDER — PROMETHAZINE HCL 25 MG/ML IJ SOLN
6.2500 mg | INTRAMUSCULAR | Status: DC | PRN
  Administered 2014-09-24: 12.5 mg via INTRAVENOUS

## 2014-09-24 MED ORDER — LACTATED RINGERS IR SOLN
Status: DC | PRN
Start: 2014-09-24 — End: 2014-09-24
  Administered 2014-09-24: 1

## 2014-09-24 MED ORDER — PROPOFOL 10 MG/ML IV BOLUS
INTRAVENOUS | Status: AC
  Filled 2014-09-24: qty 20

## 2014-09-24 MED ORDER — ROCURONIUM BROMIDE 100 MG/10ML IV SOLN
INTRAVENOUS | Status: AC
  Filled 2014-09-24: qty 1

## 2014-09-24 MED ORDER — HYDROMORPHONE HCL 1 MG/ML IJ SOLN
0.2500 mg | INTRAMUSCULAR | Status: DC | PRN
  Administered 2014-09-24 (×4): 0.5 mg via INTRAVENOUS

## 2014-09-24 MED ORDER — MORPHINE SULFATE 10 MG/ML IJ SOLN
INTRAMUSCULAR | Status: AC
  Filled 2014-09-24: qty 1

## 2014-09-24 MED ORDER — 0.9 % SODIUM CHLORIDE (POUR BTL) OPTIME
TOPICAL | Status: DC | PRN
  Administered 2014-09-24: 1000 mL

## 2014-09-24 MED ORDER — NEOSTIGMINE METHYLSULFATE 10 MG/10ML IV SOLN
INTRAVENOUS | Status: AC
  Filled 2014-09-24: qty 1

## 2014-09-24 MED ORDER — CEFAZOLIN SODIUM-DEXTROSE 2-3 GM-% IV SOLR
INTRAVENOUS | Status: AC
  Filled 2014-09-24: qty 50

## 2014-09-24 MED ORDER — LIDOCAINE HCL (CARDIAC) 20 MG/ML IV SOLN
INTRAVENOUS | Status: DC | PRN
  Administered 2014-09-24: 100 mg via INTRAVENOUS

## 2014-09-24 MED ORDER — OXYCODONE HCL 5 MG PO TABS: 5.0000 mg | ORAL_TABLET | Freq: Once | ORAL | Status: DC | PRN

## 2014-09-24 MED ORDER — IOHEXOL 300 MG/ML  SOLN
INTRAMUSCULAR | Status: DC | PRN
  Administered 2014-09-24: 7 mL

## 2014-09-24 MED ORDER — ONDANSETRON HCL 4 MG/2ML IJ SOLN
INTRAMUSCULAR | Status: AC
  Filled 2014-09-24: qty 2

## 2014-09-24 MED ORDER — OXYCODONE HCL 5 MG/5ML PO SOLN: 5.0000 mg | Freq: Once | ORAL | Status: DC | PRN

## 2014-09-24 MED ORDER — GLYCOPYRROLATE 0.2 MG/ML IJ SOLN
INTRAMUSCULAR | Status: AC
  Filled 2014-09-24: qty 3

## 2014-09-24 MED ORDER — MIDAZOLAM HCL 2 MG/2ML IJ SOLN
INTRAMUSCULAR | Status: AC
  Filled 2014-09-24: qty 2

## 2014-09-24 MED ORDER — BUPIVACAINE-EPINEPHRINE (PF) 0.25% -1:200000 IJ SOLN
INTRAMUSCULAR | Status: AC
  Filled 2014-09-24: qty 30

## 2014-09-24 MED ORDER — METOCLOPRAMIDE HCL 5 MG/ML IJ SOLN
INTRAMUSCULAR | Status: DC | PRN
  Administered 2014-09-24: 10 mg via INTRAVENOUS

## 2014-09-24 MED ORDER — PHENYLEPHRINE 40 MCG/ML (10ML) SYRINGE FOR IV PUSH (FOR BLOOD PRESSURE SUPPORT)
PREFILLED_SYRINGE | INTRAVENOUS | Status: AC
  Filled 2014-09-24: qty 20

## 2014-09-24 MED ORDER — MIDAZOLAM HCL 5 MG/5ML IJ SOLN
INTRAMUSCULAR | Status: DC | PRN
  Administered 2014-09-24: 2 mg via INTRAVENOUS

## 2014-09-24 MED ORDER — LACTATED RINGERS IV SOLN
INTRAVENOUS | Status: DC | PRN
  Administered 2014-09-24 (×2): via INTRAVENOUS

## 2014-09-24 MED ORDER — HYDROMORPHONE HCL 1 MG/ML IJ SOLN
INTRAMUSCULAR | Status: AC
  Filled 2014-09-24: qty 1

## 2014-09-24 MED ORDER — GLYCOPYRROLATE 0.2 MG/ML IJ SOLN
INTRAMUSCULAR | Status: DC | PRN
  Administered 2014-09-24: 0.6 mg via INTRAVENOUS

## 2014-09-24 MED ORDER — METOCLOPRAMIDE HCL 5 MG/ML IJ SOLN
INTRAMUSCULAR | Status: AC
  Filled 2014-09-24: qty 2

## 2014-09-24 MED ORDER — CEFAZOLIN SODIUM-DEXTROSE 2-3 GM-% IV SOLR
INTRAVENOUS | Status: DC | PRN
  Administered 2014-09-24: 2 g via INTRAVENOUS

## 2014-09-24 MED ORDER — DEXAMETHASONE SODIUM PHOSPHATE 10 MG/ML IJ SOLN
INTRAMUSCULAR | Status: DC | PRN
  Administered 2014-09-24: 10 mg via INTRAVENOUS

## 2014-09-24 MED ORDER — KETOROLAC TROMETHAMINE 30 MG/ML IJ SOLN
30.0000 mg | Freq: Once | INTRAMUSCULAR | Status: AC | PRN
  Administered 2014-09-24: 30 mg via INTRAVENOUS

## 2014-09-24 MED ORDER — LIDOCAINE HCL (CARDIAC) 20 MG/ML IV SOLN
INTRAVENOUS | Status: AC
  Filled 2014-09-24: qty 5

## 2014-09-24 MED ORDER — ONDANSETRON HCL 4 MG/2ML IJ SOLN
INTRAMUSCULAR | Status: DC | PRN
  Administered 2014-09-24: 4 mg via INTRAVENOUS

## 2014-09-24 MED ORDER — PROMETHAZINE HCL 25 MG/ML IJ SOLN
INTRAMUSCULAR | Status: AC
Start: 2014-09-24 — End: 2014-09-24
  Filled 2014-09-24: qty 1

## 2014-09-24 MED ORDER — KETOROLAC TROMETHAMINE 30 MG/ML IJ SOLN
INTRAMUSCULAR | Status: AC
  Filled 2014-09-24: qty 1

## 2014-09-24 MED ORDER — FENTANYL CITRATE 0.05 MG/ML IJ SOLN
INTRAMUSCULAR | Status: AC
  Filled 2014-09-24: qty 5

## 2014-09-24 MED ORDER — FENTANYL CITRATE 0.05 MG/ML IJ SOLN
INTRAMUSCULAR | Status: DC | PRN
  Administered 2014-09-24 (×5): 50 ug via INTRAVENOUS

## 2014-09-24 MED ORDER — EPHEDRINE SULFATE 50 MG/ML IJ SOLN
INTRAMUSCULAR | Status: AC
  Filled 2014-09-24: qty 1

## 2014-09-24 SURGICAL SUPPLY — 37 items
APPLIER CLIP ROT 10 11.4 M/L (STAPLE) ×3
BENZOIN TINCTURE PRP APPL 2/3 (GAUZE/BANDAGES/DRESSINGS) ×3 IMPLANT
CHLORAPREP W/TINT 26ML (MISCELLANEOUS) ×3 IMPLANT
CHOLANGIOGRAM CATH TAUT (CATHETERS) ×3 IMPLANT
CLIP APPLIE ROT 10 11.4 M/L (STAPLE) ×1 IMPLANT
CLOSURE WOUND 1/4X4 (GAUZE/BANDAGES/DRESSINGS) ×1
COVER MAYO STAND STRL (DRAPES) ×3 IMPLANT
DECANTER SPIKE VIAL GLASS SM (MISCELLANEOUS) ×3 IMPLANT
DRAPE C-ARM 42X120 X-RAY (DRAPES) ×3 IMPLANT
DRAPE LAPAROSCOPIC ABDOMINAL (DRAPES) ×3 IMPLANT
DRAPE UTILITY XL STRL (DRAPES) ×3 IMPLANT
ELECT REM PT RETURN 9FT ADLT (ELECTROSURGICAL) ×3
ELECTRODE REM PT RTRN 9FT ADLT (ELECTROSURGICAL) ×1 IMPLANT
GLOVE SURG SIGNA 7.5 PF LTX (GLOVE) ×3 IMPLANT
GOWN SPEC L4 XLG W/TWL (GOWN DISPOSABLE) ×3 IMPLANT
GOWN STRL REUS W/ TWL XL LVL3 (GOWN DISPOSABLE) ×3 IMPLANT
GOWN STRL REUS W/TWL XL LVL3 (GOWN DISPOSABLE) ×6
HEMOSTAT SURGICEL 4X8 (HEMOSTASIS) IMPLANT
IV CATH 14GX2 1/4 (CATHETERS) ×3 IMPLANT
IV SET MACRO CATH EXT 6 LUER (IV SETS) ×3 IMPLANT
KIT BASIN OR (CUSTOM PROCEDURE TRAY) ×3 IMPLANT
LIQUID BAND (GAUZE/BANDAGES/DRESSINGS) ×3 IMPLANT
NS IRRIG 1000ML POUR BTL (IV SOLUTION) ×3 IMPLANT
POUCH SPECIMEN RETRIEVAL 10MM (ENDOMECHANICALS) ×3 IMPLANT
SET IRRIG TUBING LAPAROSCOPIC (IRRIGATION / IRRIGATOR) ×3 IMPLANT
SLEEVE XCEL OPT CAN 5 100 (ENDOMECHANICALS) ×3 IMPLANT
STOPCOCK 4 WAY LG BORE MALE ST (IV SETS) ×3 IMPLANT
STRIP CLOSURE SKIN 1/4X4 (GAUZE/BANDAGES/DRESSINGS) ×2 IMPLANT
SUT VIC AB 3-0 SH 27 (SUTURE) ×2
SUT VIC AB 3-0 SH 27XBRD (SUTURE) ×1 IMPLANT
SUT VIC AB 5-0 PS2 18 (SUTURE) ×3 IMPLANT
TOWEL OR 17X26 10 PK STRL BLUE (TOWEL DISPOSABLE) ×3 IMPLANT
TOWEL OR NON WOVEN STRL DISP B (DISPOSABLE) ×3 IMPLANT
TRAY LAPAROSCOPIC (CUSTOM PROCEDURE TRAY) ×3 IMPLANT
TROCAR BLADELESS OPT 5 100 (ENDOMECHANICALS) ×3 IMPLANT
TROCAR XCEL BLUNT TIP 100MML (ENDOMECHANICALS) ×3 IMPLANT
TROCAR XCEL NON-BLD 11X100MML (ENDOMECHANICALS) ×3 IMPLANT

## 2014-09-24 NOTE — Transfer of Care (Signed)
Immediate Anesthesia Transfer of Care Note  Patient: Erin Figueroa  Procedure(s) Performed: Procedure(s) (LRB): LAPAROSCOPIC CHOLECYSTECTOMY WITH INTRAOPERATIVE CHOLANGIOGRAM (N/A)  Patient Location: PACU  Anesthesia Type: General  Level of Consciousness: sedated, patient cooperative and responds to stimulation  Airway & Oxygen Therapy: Patient Spontanous Breathing and Patient connected to face mask oxgen  Post-op Assessment: Report given to PACU RN and Post -op Vital signs reviewed and stable  Post vital signs: Reviewed and stable  Complications: No apparent anesthesia complications

## 2014-09-24 NOTE — Anesthesia Procedure Notes (Signed)
Procedure Name: Intubation Date/Time: 09/24/2014 7:59 AM Performed by: Epimenio SarinJARVELA, Kymberlyn Eckford R Pre-anesthesia Checklist: Patient identified, Emergency Drugs available, Suction available, Patient being monitored and Timeout performed Patient Re-evaluated:Patient Re-evaluated prior to inductionOxygen Delivery Method: Circle system utilized Preoxygenation: Pre-oxygenation with 100% oxygen Intubation Type: IV induction Ventilation: Mask ventilation without difficulty Laryngoscope Size: Mac and 3 Grade View: Grade II Tube type: Oral Tube size: 7.5 mm Number of attempts: 1 Airway Equipment and Method: Stylet Placement Confirmation: ETT inserted through vocal cords under direct vision,  positive ETCO2 and breath sounds checked- equal and bilateral Secured at: 21 cm Tube secured with: Tape Dental Injury: Teeth and Oropharynx as per pre-operative assessment

## 2014-09-24 NOTE — Consult Note (Signed)
Referring Provider: Dr. Ezzard Standing Primary Care Physician:  Rudi Heap, MD Primary Gastroenterologist:  Gentry Fitz  Reason for Consultation:  CBD stones  HPI: Erin Figueroa is a 24 y.o. female s/p lap chole today with an intraoperative cholangiogram showing several distal CBD stones. LFTs, lipase were elevated on admit as stated below. Reports abdominal pain started on Thursday. U/S showed cholelithiasis without acute cholecystitis. Multiple family members in the room.  Results for Erin Figueroa, Erin Figueroa (MRN [id number]) as of 09/24/2014 13:08  Ref. Range 09/23/2014 09:47 09/24/2014 05:37  Alkaline Phosphatase Latest Range: 39-117 U/L 224 (H) 217 (H)  Albumin Latest Range: 3.5-5.2 g/dL 4.6 4.4  Lipase Latest Range: 11-59 U/L 117 (H) 30  AST Latest Range: 0-37 U/L 179 (H) 115 (H)  ALT Latest Range: 0-35 U/L 360 (H) 293 (H)  Total Protein Latest Range: 6.0-8.3 g/dL 8.4 (H) 8.0  Total Bilirubin Latest Range: 0.3-1.2 mg/dL 1.0 1.7 (H)       History reviewed. No pertinent past medical history.  Past Surgical History  Procedure Laterality Date  . Cesarean section      Prior to Admission medications   Medication Sig Start Date End Date Taking? Authorizing Provider  ibuprofen (ADVIL,MOTRIN) 200 MG tablet Take 400 mg by mouth every 6 (six) hours as needed for mild pain.   Yes Historical Provider, MD  Alum & Mag Hydroxide-Simeth (MAGIC MOUTHWASH) SOLN Take 5 mLs by mouth 4 (four) times daily as needed for mouth pain. Patient not taking: Reported on 09/23/2014 05/18/14   Deatra Canter, FNP  amoxicillin (AMOXIL) 875 MG tablet Take 1 tablet (875 mg total) by mouth 2 (two) times daily. Patient not taking: Reported on 09/23/2014 05/19/14   Mary-Margaret Daphine Deutscher, FNP  naproxen (NAPROSYN) 500 MG tablet Take 1 tablet (500 mg total) by mouth 2 (two) times daily with a meal. Patient not taking: Reported on 09/23/2014 05/18/14   Deatra Canter, FNP    Scheduled Meds: . HYDROmorphone      .  HYDROmorphone      . ketorolac      . morphine      . promethazine       Continuous Infusions: . 0.45 % NaCl with KCl 20 mEq / L 150 mL/hr (09/24/14 1239)   PRN Meds:.HYDROcodone-acetaminophen, morphine injection, morphine injection, ondansetron, promethazine  Allergies as of 09/23/2014 - Review Complete 09/23/2014  Allergen Reaction Noted  . Ciprofloxacin  09/23/2014  . Sulfa antibiotics Nausea Only 02/16/2013    Family History  Problem Relation Age of Onset  . Depression Mother   . Diabetes Father   . Hypertension Father     History   Social History  . Marital Status: Married    Spouse Name: N/A    Number of Children: N/A  . Years of Education: N/A   Occupational History  . Not on file.   Social History Main Topics  . Smoking status: Never Smoker   . Smokeless tobacco: Not on file  . Alcohol Use: No  . Drug Use: No  . Sexual Activity: Not on file   Other Topics Concern  . Not on file   Social History Narrative    Review of Systems: All negative except as stated above in HPI.  Physical Exam: Vital signs: Filed Vitals:   09/24/14 1040  BP: 119/64  Pulse: 76  Temp: 99.1 F (37.3 C)  Resp: 12   Last BM Date: 09/23/14 General:   pleasant and cooperative in NAD, obese HEENT: anicteric Lungs:  Clear throughout  to auscultation.   No wheezes, crackles, or rhonchi. No acute distress. Heart:  Regular rate and rhythm; no murmurs, clicks, rubs,  or gallops. Abdomen: soft, mild distention, mild tenderness diffusely, +BS  Rectal:  Deferred Skin: no rash Neuro: lethargic, oriented  GI:  Lab Results:  Recent Labs  09/23/14 0947 09/24/14 0537  WBC 9.0 9.2  HGB 14.2 14.1  HCT 42.5 43.3  PLT 273 275   BMET  Recent Labs  09/23/14 0947 09/24/14 0537  NA 136 135  K 3.7 4.6  CL 105 101  CO2 25 23  GLUCOSE 90 71  BUN 14 9  CREATININE 0.76 0.75  CALCIUM 9.2 9.1   LFT  Recent Labs  09/24/14 0537  PROT 8.0  ALBUMIN 4.4  AST 115*  ALT 293*   ALKPHOS 217*  BILITOT 1.7*   PT/INR No results for input(s): LABPROT, INR in the last 72 hours.   Studies/Results: Dg Cholangiogram Operative  09/24/2014   CLINICAL DATA:  Cholelithiasis.  Laparoscopic cholecystectomy.  EXAM: INTRAOPERATIVE CHOLANGIOGRAM  TECHNIQUE: Cholangiographic images from the C-arm fluoroscopic device were submitted for interpretation post-operatively. Please see the procedural report for the amount of contrast and the fluoroscopy time utilized.  COMPARISON:  Abdominal ultrasound yesterday.  FINDINGS: At least 4 filling defects in the distal common bile duct, the largest just above the ampulla, consistent with bile duct stones. These are nonobstructive, as the contrast material enters the duodenum and there is no biliary ductal dilation. No filling defects elsewhere in the biliary tree.  IMPRESSION: At least 4 nonobstructing gallstones in the distal common bile duct.   Electronically Signed   By: Hulan Saas M.D.   On: 09/24/2014 10:03   US Abdomen Complete  09/23/2014   CLINICAL DATA:  Initial encounter for right upper quadrant pain for 3 days of vomiting.  EXAM: ULTRASOUND ABDOMEN COMPLETE  COMPARISON:  None.  FINDINGS: Gallbladder: Gallstones, measuring up to 9 mm. No wall thickening or pericholecystic fluid. Sonographic Murphy's sign was not elicited.  Common bile duct: Diameter: Dilated for age, 8 mm. Upper normal 5-6 mm. No evidence of choledocholithiasis.  Liver: No focal lesion identified. Within normal limits in parenchymal echogenicity.  IVC: No abnormality visualized.  Pancreas: Visualized portion unremarkable.  Spleen: Size and appearance within normal limits.  Right Kidney: Length: 11.6 cm. Echogenicity within normal limits. No mass or hydronephrosis visualized.  Left Kidney: Length: 10.9 cm. Echogenicity within normal limits. No mass or hydronephrosis visualized.  Abdominal aorta: No aneurysm visualized.  Other findings: No ascites.  IMPRESSION: 1.  Cholelithiasis without acute cholecystitis. 2. Mild common duct dilatation for age. Given normal bilirubin levels, this is of questionable significance. Further imaging evaluation (especially given elevated lipase) with CT or MRCP may be informative.   Electronically Signed   By: Jeronimo Greaves M.D.   On: 09/23/2014 12:27    Impression/Plan: 24 yo s/p lap chole (no acute GB inflammation seen by Dr. Ezzard Standing) with a positive IOC ("at least 4 nonobstructing" distal CBD stones seen) in need of an ERCP. Risks/benefits of an ERCP were discussed with the patient and her family and she agrees to proceed. ERCP tomorrow morning by Dr. Ewing Schlein. Clear liquids today. NPO p MN. Patient really wants to go home tomorrow after the procedure but is aware that she may need to be monitored in the hospital following the ERCP until Tuesday but final decision will depend on Dr. Marlane Hatcher and Dr. Allene Pyo recommendation.    LOS: 1 day   Aasha Dina  C.  09/24/2014, 12:55 PM

## 2014-09-24 NOTE — Anesthesia Preprocedure Evaluation (Addendum)
Anesthesia Evaluation  Patient identified by MRN, date of birth, ID band Patient awake    Reviewed: Allergy & Precautions, NPO status , Patient's Chart, lab work & pertinent test results  Airway Mallampati: III  TM Distance: >3 FB Neck ROM: Full    Dental  (+) Teeth Intact   Pulmonary neg pulmonary ROS,          Cardiovascular negative cardio ROS      Neuro/Psych negative neurological ROS  negative psych ROS   GI/Hepatic Neg liver ROS, cholecystitis   Endo/Other  Morbid obesity  Renal/GU negative Renal ROS     Musculoskeletal negative musculoskeletal ROS (+)   Abdominal   Peds  Hematology negative hematology ROS (+)   Anesthesia Other Findings   Reproductive/Obstetrics                            Anesthesia Physical Anesthesia Plan  ASA: II  Anesthesia Plan: General   Post-op Pain Management:    Induction: Intravenous  Airway Management Planned: Oral ETT  Additional Equipment:   Intra-op Plan:   Post-operative Plan: Extubation in OR  Informed Consent: I have reviewed the patients History and Physical, chart, labs and discussed the procedure including the risks, benefits and alternatives for the proposed anesthesia with the patient or authorized representative who has indicated his/her understanding and acceptance.   Dental advisory given  Plan Discussed with: CRNA  Anesthesia Plan Comments:         Anesthesia Quick Evaluation

## 2014-09-24 NOTE — Anesthesia Postprocedure Evaluation (Signed)
  Anesthesia Post-op Note  Patient: Erin Figueroa  Procedure(s) Performed: Procedure(s): LAPAROSCOPIC CHOLECYSTECTOMY WITH INTRAOPERATIVE CHOLANGIOGRAM (N/A)  Patient Location: PACU  Anesthesia Type:General  Level of Consciousness: awake and alert   Airway and Oxygen Therapy: Patient Spontanous Breathing  Post-op Pain: none  Post-op Assessment: Post-op Vital signs reviewed  Post-op Vital Signs: Reviewed  Last Vitals:  Filed Vitals:   09/24/14 1040  BP: 119/64  Pulse: 76  Temp: 37.3 C  Resp: 12    Complications: No apparent anesthesia complications

## 2014-09-24 NOTE — Op Note (Signed)
09/23/2014 - 09/24/2014  9:24 AM  PATIENT:  Erin HumphreyPaige Apolinar, 24 y.o., female, MRN: [id number]  PREOP DIAGNOSIS:  Symptomatic cholelithiasis, mild pancreatitis resolved  POSTOP DIAGNOSIS:   Symptomatic cholelithiasis,  choledocholithiasis  PROCEDURE:   Procedure(s):  LAPAROSCOPIC CHOLECYSTECTOMY WITH INTRAOPERATIVE CHOLANGIOGRAM  SURGEON:   Ovidio Kinavid Kelsye Loomer, M.D.  Threasa HeadsASSISTANSheron Nightingale:   M. Martin, M.D.  ANESTHESIA:   general  Anesthesiologist: Kennieth Radobert E Fitzgerald, MD CRNA: Epimenio SarinJoshua R Jarvela, CRNA  General  ASA: 2E  EBL:  minimal  ml  BLOOD ADMINISTERED: none  DRAINS: none   LOCAL MEDICATIONS USED:   25 cc 1/4% marcaine  SPECIMEN:   Gall bladder  COUNTS CORRECT:  YES  INDICATIONS FOR PROCEDURE:  Erin Humphreyaige Qadri is a 24 y.o. (DOB: 12/12/1990) white  female whose primary care physician is Rudi HeapMOORE, DONALD, MD (I think that Dr. Gae GallopQuereshi is her PCP) and comes for cholecystectomy.   The indications and risks of the gall bladder surgery were explained to the patient.  The risks include, but are not limited to, infection, bleeding, common bile duct injury and open surgery.  SURGERY:  The patient was [facility].  The abdomen was prepped with chloroprep.  The patient was given 2 gm Ancef at the beginning of the operation.   A time out was held and the surgical checklist run.   An infraumbilical incision was made into the abdominal cavity.  A 12 mm Hasson trocar was inserted into the abdominal cavity through the infraumbilical incision and secured with a 0 Vicryl suture.  Three additional trocars were inserted: a 10 mm trocar in the sub-xiphoid location, a 5 mm trocar in the right mid subcostal area, and a 5 mm trocar in the right lateral subcostal area.   The abdomen was explored and the liver, stomach, and bowel that could be seen were unremarkable.   The gall bladder did not have evidence of acute infection. The gall bladder was grasped, and rotated cephalad.  Disssection was  carried down to the gall bladder/cystic duct junction and the cystic duct isolated.  A clip was placed on the gall bladder side of the cystic duct.   An intra-operative cholangiogram was shot.   The intra-operative cholangiogram was shot using a cut off Taut catheter placed through a 14 gauge angiocath in the RUQ.  The Taut catheter was inserted in the cut cystic duct and secured with an endoclip.  A cholangiogram was shot with 12 cc of 1/2 strength Omnipaque.  Using fluoroscopy, the cholangiogram showed the flow of contrast into the common bile duct, up the hepatic radicals, and into the duodenum.  She had multiple filling defects in the distal CBD.  This is consistent with choledocholithiasis.  This would explain her elevated liver function tests and her normal appearing gall bladder.   The Taut catheter was removed.  The cystic duct was tripley endoclipped and the cystic artery was identified and clipped.  The gall bladder was bluntly and sharpley dissected from the gall bladder bed.   After the gall bladder was removed from the liver, the gall bladder bed and Triangle of [place]ot were inspected.  There was no bleeding or bile leak.  The gall bladder was placed in a endocatch bag and delivered through the umbilicus.  The abdomen was irrigated with 1,000 cc saline.   The trocars were then removed.  I infiltrated 25 cc of 1/4% Marcaine into the incisions.  The umbilical port closed with a 0 Vicryl suture and the skin  closed with 5-0 Monocryl.  The skin was painted with Dermabond.  The patient's sponge and needle count were correct.  The patient was transported to the RR in good condition.   I will ask GI to see the patient for ERCP.  Ovidio Kin, MD, [facility] Surgery Pager: [phone] Office phone:  [phone]

## 2014-09-25 ENCOUNTER — Inpatient Hospital Stay (HOSPITAL_COMMUNITY): Payer: PRIVATE HEALTH INSURANCE | Admitting: Anesthesiology

## 2014-09-25 ENCOUNTER — Encounter (HOSPITAL_COMMUNITY): Payer: Self-pay | Admitting: *Deleted

## 2014-09-25 ENCOUNTER — Inpatient Hospital Stay (HOSPITAL_COMMUNITY): Payer: PRIVATE HEALTH INSURANCE

## 2014-09-25 ENCOUNTER — Encounter (HOSPITAL_COMMUNITY): Admission: EM | Disposition: A | Discharge status: Home / Self Care | Payer: Self-pay | Source: Home / Self Care

## 2014-09-25 HISTORY — PX: ENDOSCOPIC RETROGRADE CHOLANGIOPANCREATOGRAPHY (ERCP) WITH PROPOFOL: SHX5810

## 2014-09-25 LAB — COMPREHENSIVE METABOLIC PANEL
ALK PHOS: 164 U/L — AB (ref 39–117)
ALT: 179 U/L — AB (ref 0–35)
ANION GAP: 8 (ref 5–15)
AST: 52 U/L — ABNORMAL HIGH (ref 0–37)
Albumin: 3.7 g/dL (ref 3.5–5.2)
BILIRUBIN TOTAL: 0.7 mg/dL (ref 0.3–1.2)
BUN: 9 mg/dL (ref 6–23)
CO2: 23 mmol/L (ref 19–32)
Calcium: 8.7 mg/dL (ref 8.4–10.5)
Chloride: 107 mEq/L (ref 96–112)
Creatinine, Ser: 0.61 mg/dL (ref 0.50–1.10)
GFR calc non Af Amer: >90 mL/min (ref >90)
Glucose, Bld: 117 mg/dL — ABNORMAL HIGH (ref 70–99)
Potassium: 4.3 mmol/L (ref 3.5–5.1)
Sodium: 138 mmol/L (ref 135–145)
Total Protein: 6.7 g/dL (ref 6.0–8.3)

## 2014-09-25 LAB — CBC WITH DIFFERENTIAL/PLATELET
Basophils Absolute: 0 10*3/uL (ref 0.0–0.1)
Basophils Relative: 0 % (ref 0–1)
Eosinophils Absolute: 0 10*3/uL (ref 0.0–0.7)
Eosinophils Relative: 0 % (ref 0–5)
HEMATOCRIT: 37.7 % (ref 36.0–46.0)
HEMOGLOBIN: 12.3 g/dL (ref 12.0–15.0)
Lymphocytes Relative: 8 % — ABNORMAL LOW (ref 12–46)
Lymphs Abs: 1.1 10*3/uL (ref 0.7–4.0)
MCH: 28.2 pg (ref 26.0–34.0)
MCHC: 32.6 g/dL (ref 30.0–36.0)
MCV: 86.5 fL (ref 78.0–100.0)
Monocytes Absolute: 0.7 10*3/uL (ref 0.1–1.0)
Monocytes Relative: 5 % (ref 3–12)
NEUTROS ABS: 11.7 10*3/uL — AB (ref 1.7–7.7)
Neutrophils Relative %: 87 % — ABNORMAL HIGH (ref 43–77)
Platelets: 239 10*3/uL (ref 150–400)
RBC: 4.36 MIL/uL (ref 3.87–5.11)
RDW: 12.9 % (ref 11.5–15.5)
WBC: 13.4 10*3/uL — AB (ref 4.0–10.5)

## 2014-09-25 SURGERY — ENDOSCOPIC RETROGRADE CHOLANGIOPANCREATOGRAPHY (ERCP) WITH PROPOFOL
Anesthesia: General

## 2014-09-25 MED ORDER — HYDROCODONE-ACETAMINOPHEN 5-325 MG PO TABS: 1.0000 | ORAL_TABLET | ORAL | Status: DC | PRN

## 2014-09-25 MED ORDER — SODIUM CHLORIDE 0.9 % IV SOLN
INTRAVENOUS | Status: DC | PRN
  Administered 2014-09-25: 11:00:00

## 2014-09-25 MED ORDER — AMPICILLIN-SULBACTAM SODIUM 1.5 (1-0.5) G IJ SOLR
1.5000 g | Freq: Once | INTRAMUSCULAR | Status: AC
  Administered 2014-09-25: 1.5 g via INTRAVENOUS
  Filled 2014-09-25: qty 1.5

## 2014-09-25 MED ORDER — SODIUM CHLORIDE 0.9 % IV SOLN: INTRAVENOUS | Status: DC

## 2014-09-25 MED ORDER — ROCURONIUM BROMIDE 100 MG/10ML IV SOLN
INTRAVENOUS | Status: DC | PRN
  Administered 2014-09-25: 20 mg via INTRAVENOUS

## 2014-09-25 MED ORDER — SUCCINYLCHOLINE CHLORIDE 20 MG/ML IJ SOLN
INTRAMUSCULAR | Status: DC | PRN
Start: 2014-09-25 — End: 2014-09-25
  Administered 2014-09-25: 120 mg via INTRAVENOUS

## 2014-09-25 MED ORDER — HYDROMORPHONE HCL 1 MG/ML IJ SOLN: 0.2500 mg | INTRAMUSCULAR | Status: DC | PRN

## 2014-09-25 MED ORDER — MIDAZOLAM HCL 5 MG/5ML IJ SOLN
INTRAMUSCULAR | Status: DC | PRN
  Administered 2014-09-25: 1 mg via INTRAVENOUS

## 2014-09-25 MED ORDER — LACTATED RINGERS IV SOLN
INTRAVENOUS | Status: DC | PRN
  Administered 2014-09-25: 10:00:00 via INTRAVENOUS

## 2014-09-25 MED ORDER — LIDOCAINE HCL (CARDIAC) 20 MG/ML IV SOLN
INTRAVENOUS | Status: DC | PRN
  Administered 2014-09-25: 80 mg via INTRAVENOUS

## 2014-09-25 MED ORDER — ARTIFICIAL TEARS OP OINT
TOPICAL_OINTMENT | OPHTHALMIC | Status: DC | PRN
  Administered 2014-09-25: 1 via OPHTHALMIC

## 2014-09-25 MED ORDER — PROPOFOL 10 MG/ML IV BOLUS
INTRAVENOUS | Status: DC | PRN
Start: 2014-09-25 — End: 2014-09-25
  Administered 2014-09-25: 170 mg via INTRAVENOUS

## 2014-09-25 MED ORDER — FENTANYL CITRATE 0.05 MG/ML IJ SOLN
INTRAMUSCULAR | Status: DC | PRN
  Administered 2014-09-25: 50 ug via INTRAVENOUS

## 2014-09-25 NOTE — Progress Notes (Signed)
Erin Figueroa 10:30 AM  Subjective: Patient doing better than she was preoperative and her case was discussed with her and her husband and her hospital computer chart was reviewed and she has no new complaints  Objective: Vital signs stable afebrile acute distress exam please see preassessment evaluation labs and x-rays reviewed including ultrasound and Intra-Op cholangiogram  Assessment: Residual CBD stones  Plan: The risks benefits methods and success rate of ERCP was rediscussed with the patient and her husband and will proceed today with anesthesia assistance  [facility],Tonie Vizcarrondo E

## 2014-09-25 NOTE — Discharge Instructions (Addendum)
LAPAROSCOPIC SURGERY: POST OP INSTRUCTIONS ° °1. DIET: Follow a light bland diet the first 24 hours after arrival home, such as soup, liquids, crackers, etc.  Be sure to include lots of fluids daily.  Avoid fast food or heavy meals as your are more likely to get nauseated.  Eat a low fat the next few days after surgery.   °2. Take your usually prescribed home medications unless otherwise directed. °3. PAIN CONTROL: °a. Pain is best controlled by a usual combination of three different methods TOGETHER: °i. Ice/Heat °ii. Over the counter pain medication °iii. Prescription pain medication °b. Most patients will experience some swelling and bruising around the incisions.  Ice packs or heating pads (30-60 minutes up to 6 times a day) will help. Use ice for the first few days to help decrease swelling and bruising, then switch to heat to help relax tight/sore spots and speed recovery.  Some people prefer to use ice alone, heat alone, alternating between ice & heat.  Experiment to what works for you.  Swelling and bruising can take several weeks to resolve.   °c. It is helpful to take an over-the-counter pain medication regularly for the first few weeks.  Choose one of the following that works best for you: °i. Naproxen (Aleve, etc)  Two 220mg tabs twice a day °ii. Ibuprofen (Advil, etc) Three 200mg tabs four times a day (every meal & bedtime) °d. A  prescription for pain medication (such as percocet, vicodin, oxycodone, hydrocodone, etc) should be given to you upon discharge.  Take your pain medication as prescribed.  °i. If you are having problems/concerns with the prescription medicine (does not control pain, nausea, vomiting, rash, itching, etc), please call us [phone] to see if we need to switch you to a different pain medicine that will work better for you and/or control your side effect better. °ii. If you need a refill on your pain medication, please contact your pharmacy.  They will contact our office to  request authorization. Prescriptions will not be filled after 5 pm or on week-ends. ° ° °4. Avoid getting constipated.  Between the surgery and the pain medications, it is common to experience some constipation.  Increasing fluid intake and taking a fiber supplement (such as Metamucil, Citrucel, FiberCon, MiraLax, etc) 1-2 times a day regularly will usually help prevent this problem from occurring.  A mild laxative (prune juice, Milk of Magnesia, MiraLax, etc) should be taken according to package directions if there are no bowel movements after 48 hours.   °5. Watch out for diarrhea.  If you have many loose bowel movements, simplify your diet to bland foods & liquids for a few days.  Stop any stool softeners and decrease your fiber supplement.  Switching to mild anti-diarrheal medications (Kayopectate, Pepto Bismol) can help.  If this worsens or does not improve, please call us. °6. Wash / shower every day.  You may shower over the dressings as they are waterproof.  Continue to shower over incision(s) after the dressing is off. °7. Remove your waterproof bandages 5 days after surgery.  You may leave the incision open to air.  You may replace a dressing/Band-Aid to cover the incision for comfort if you wish.  °8. ACTIVITIES as tolerated:   °a. You may resume regular (light) daily activities beginning the next day--such as daily self-care, walking, climbing stairs--gradually increasing activities as tolerated.  If you can walk 30 minutes without difficulty, it is safe to try more intense activity such as   jogging, treadmill, bicycling, low-impact aerobics, swimming, etc. b. Save the most intensive and strenuous activity for last such as sit-ups, heavy lifting, contact sports, etc  Refrain from any heavy lifting or straining until you are off narcotics for pain control.   c. DO NOT PUSH THROUGH PAIN.  Let pain be your guide: If it hurts to do something, don't do it.  Pain is your body warning you to avoid that  activity for another week until the pain goes down. d. You may drive when you are no longer taking prescription pain medication, you can comfortably wear a seatbelt, and you can safely maneuver your car and apply brakes. e. Bonita QuinYou may have sexual intercourse when it is comfortable.  9. FOLLOW UP in our office a. Please call [facility] at [phone] to set up an appointment to see your surgeon in the office for a follow-up appointment approximately 2-3 weeks after your surgery. b. Make sure that you call for this appointment the day you arrive home to insure a convenient appointment time. 10. IF YOU HAVE DISABILITY OR FAMILY LEAVE FORMS, BRING THEM TO THE OFFICE FOR PROCESSING.  DO NOT GIVE THEM TO YOUR DOCTOR.   WHEN TO CALL US [phone]: 1. Poor pain control 2. Reactions / problems with new medications (rash/itching, nausea, etc)  3. Fever over 101.5 F (38.5 C) 4. Inability to urinate 5. Nausea and/or vomiting 6. Worsening swelling or bruising 7. Continued bleeding from incision. 8. Increased pain, redness, or drainage from the incision   The clinic staff is available to answer your questions during regular business hours (8:30am-5pm).  Please dont hesitate to call and ask to speak to one of our nurses for clinical concerns.   If you have a medical emergency, go to the nearest emergency room or call 911.  A surgeon from [facility] is always on call at the [facility]   [facility], [place] [address], [address], [place], [place]  [address] ? MAIN: [phone] ? TOLL FREE: [phone] ?  FAX [phone] [email or web address]  Laparoscopic Cholecystectomy, Care After Refer to this sheet in the next few weeks. These instructions provide you with information on caring for yourself after your procedure. Your health care provider may also give you more specific instructions. Your treatment has been planned according to current medical practices,  but problems sometimes occur. Call your health care provider if you have any problems or questions after your procedure. WHAT TO EXPECT AFTER THE PROCEDURE After your procedure, it is typical to have the following:  Pain at your incision sites. You will be given pain medicines to control the pain.  Mild nausea or vomiting. This should improve after the first 24 hours.  Bloating and possibly shoulder pain from the gas used during the procedure. This will improve after the first 24 hours. HOME CARE INSTRUCTIONS   Change bandages (dressings) as directed by your health care provider.  Keep the wound dry and clean. You may wash the wound gently with soap and water. Gently blot or dab the area dry.  Do not take baths or use swimming pools or hot tubs for 2 weeks or until your health care provider approves.  Only take over-the-counter or prescription medicines as directed by your health care provider.  Continue your normal diet as directed by your health care provider.  Do not lift anything heavier than 10 pounds (4.5 kg) until your health care provider approves.  Do not play contact sports for  1 week or until your health care provider approves. SEEK MEDICAL CARE IF:   You have redness, swelling, or increasing pain in the wound.  You notice yellowish-white fluid (pus) coming from the wound.  You have drainage from the wound that lasts longer than 1 day.  You notice a bad smell coming from the wound or dressing.  Your surgical cuts (incisions) break open. SEEK IMMEDIATE MEDICAL CARE IF:   You develop a rash.  You have difficulty breathing.  You have chest pain.  You have a fever.  You have increasing pain in the shoulders (shoulder strap areas).  You have dizzy episodes or faint while standing.  You have severe abdominal pain.  You feel sick to your stomach (nauseous) or throw up (vomit) and this lasts for more than 1 day. Document Released: 08/25/2005 Document Revised:  06/15/2013 Document Reviewed: 04/06/2013 [facility] Patient Information 2015 [place], [place]. This information is not intended to replace advice given to you by your health care provider. Make sure you discuss any questions you have with your health care provider.  Endoscopic Retrograde Cholangiopancreatography (ERCP) Endoscopic retrograde cholangiopancreatography (ERCP) is a procedure used to diagnosis many diseases of the pancreas, bile ducts, liver, and gallbladder. During ERCP a thin, lighted tube (endoscope) is passed through the mouth and down the back of the throat into the first part of the small intestine (duodenum). A small, plastic tube (cannula) is then passed through the endoscope and directed into the bile duct or pancreatic duct. Dye is then injected through the cannula and X-rays are taken to study the biliary and pancreatic passageways.  LET [facility] CARE PROVIDER KNOW ABOUT:   Any allergies you have.   All medicines you are taking, including vitamins, herbs, eyedrops, creams, and over-the-counter medicines.   Previous problems you or members of your family have had with the use of anesthetics.   Any blood disorders you have.   Previous surgeries you have had.   Medical conditions you have. RISKS AND COMPLICATIONS Generally, ERCP is a safe procedure. However, as with any procedure, complications can occur. A simple removal of gallstones has the lowest rate of complications. Higher rates of complication occur in people who have poorly functioning bile or pancreatic ducts. Possible complications include:   Pancreatitis.  Bleeding.  Accidental punctures in the bowel wall, pancreas, or gall bladder.  Gall bladder or bile duct infection. BEFORE THE PROCEDURE   Do not eat or drink anything, including water, for at least 8 hours before the procedure or as directed by your health care provider.   Ask your health care provider whether you should stop taking certain  medicines prior to your procedure.   Arrange for someone to drive you home. You will not be allowed to drive for 78-46 hours after the procedure. PROCEDURE   You will be given medicine through a vein (intravenously) to make you relaxed and sleepy.   You might have a breathing tube placed to give you medicine that makes you sleep (general anesthetic).   Your throat may be sprayed with medicine that numbs the area and prevents gagging (local anesthetic), or you may gargle this medicine.   You will lie on your left side.   The endoscope will be inserted through your mouth and into the duodenum. The tube will not interfere with your breathing. Gagging is prevented by the anesthesia.   While X-rays are being taken, you may be positioned on your stomach.   A small sample of tissue (biopsy)  may be removed for examination. AFTER THE PROCEDURE   You will rest in bed until you are fully conscious.   When you first wake up, your throat may feel slightly sore.   You will not be allowed to eat or drink until numbness subsides.   Once you are able to drink, urinate, and sit on the edge of the bed without feeling sick to your stomach (nauseous) or dizzy, you may be allowed to go home. Document Released: 05/20/2001 Document Revised: 06/15/2013 Document Reviewed: 04/05/2013 [facility] Patient Information 2015 [place], [place]. This information is not intended to replace advice given to you by your health care provider. Make sure you discuss any questions you have with your health care provider.

## 2014-09-25 NOTE — Anesthesia Preprocedure Evaluation (Signed)
Anesthesia Evaluation  Patient identified by MRN, date of birth, ID band Patient awake    Reviewed: Allergy & Precautions, NPO status , Patient's Chart, lab work & pertinent test results  Airway Mallampati: II  TM Distance: >3 FB Neck ROM: Full    Dental   Pulmonary neg pulmonary ROS,    breath sounds clear to auscultation       Cardiovascular negative cardio ROS   Rhythm:Regular Rate:Normal     Neuro/Psych    GI/Hepatic negative GI ROS, Neg liver ROS,   Endo/Other  negative endocrine ROS  Renal/GU negative Renal ROS     Musculoskeletal   Abdominal   Peds  Hematology   Anesthesia Other Findings   Reproductive/Obstetrics                             Anesthesia Physical Anesthesia Plan  ASA: II  Anesthesia Plan: General   Post-op Pain Management:    Induction: Intravenous  Airway Management Planned: Oral ETT  Additional Equipment:   Intra-op Plan:   Post-operative Plan: Extubation in OR  Informed Consent: I have reviewed the patients History and Physical, chart, labs and discussed the procedure including the risks, benefits and alternatives for the proposed anesthesia with the patient or authorized representative who has indicated his/her understanding and acceptance.   Dental advisory given  Plan Discussed with: CRNA and Anesthesiologist  Anesthesia Plan Comments:         Anesthesia Quick Evaluation  

## 2014-09-25 NOTE — Anesthesia Postprocedure Evaluation (Signed)
  Anesthesia Post-op Note  Patient: Erin Figueroa  Procedure(s) Performed: Procedure(s): ENDOSCOPIC RETROGRADE CHOLANGIOPANCREATOGRAPHY (ERCP) WITH PROPOFOL (N/A)  Patient Location: PACU  Anesthesia Type:General  Level of Consciousness: awake  Airway and Oxygen Therapy: Patient Spontanous Breathing  Post-op Pain: mild  Post-op Assessment: Post-op Vital signs reviewed  Post-op Vital Signs: Reviewed  Last Vitals:  Filed Vitals:   09/25/14 1205  BP: 120/81  Pulse: 61  Temp:   Resp: 19    Complications: No apparent anesthesia complications

## 2014-09-25 NOTE — Anesthesia Procedure Notes (Signed)
Procedure Name: Intubation Date/Time: 09/25/2014 10:41 AM Performed by: Fransisca KaufmannMEYER, [place] Cantero E Pre-anesthesia Checklist: Patient identified, Emergency Drugs available, Suction available, Patient being monitored and Timeout performed Patient Re-evaluated:Patient Re-evaluated prior to inductionOxygen Delivery Method: Circle system utilized Preoxygenation: Pre-oxygenation with 100% oxygen Intubation Type: IV induction Ventilation: Mask ventilation without difficulty Laryngoscope Size: Mac and 3 Grade View: Grade I Tube type: Oral Tube size: 7.5 mm Number of attempts: 1 Airway Equipment and Method: Stylet Placement Confirmation: ETT inserted through vocal cords under direct vision,  CO2 detector and breath sounds checked- equal and bilateral Secured at: 22 cm Tube secured with: Tape Dental Injury: Teeth and Oropharynx as per pre-operative assessment

## 2014-09-25 NOTE — Op Note (Signed)
[facility] [address] [place], [id number]   ERCP PROCEDURE REPORT  PATIENT: Erin Figueroa, Lilia  MR# :[id number] BIRTHDATE: [birth year]  GENDER: female ENDOSCOPIST: Vida RiggerMarc Ollivander See, MD REFERRED BY: PROCEDURE DATE:  09/25/2014 PROCEDURE:   ERCP with sphincterotomy/papillotomy and ERCP with removal of calculus/calculi ASA CLASS:   1 INDICATIONS: positive Intra-Op cholangiogram MEDICATIONS:    general anesthesia TOPICAL ANESTHETIC:  none  DESCRIPTION OF PROCEDURE:   After the risks benefits and alternatives of the procedure were thoroughly explained, informed consent was obtained.  The Pentax Ercp Scope [id number]  endoscope was introduced through the mouth and advanced to the second portion of the duodenum .a normal ampulla was brought into view and using the triple lumen sphincterotome loaded with the JAG Jagwire deep selective cannulation was obtained and some small stones were seen on initial cholangiogram and there were no pancreatic duct injections and no pancreatic duct wire advancements throughout the procedure and the wire was advanced into the intrahepatics and we proceeded with a moderate size sphincterotomy in the customary fashion until we had adequate biliary drainage and could get the fully bowed sphincterotome easily in and out of the duct and when we withdrew the sphincterotome after the sphincterotomy one stone was deliveredand we exchanged the sphincterotome for the adjustable balloon and proceeded with multiple balloon pull-through was using the 12 mm balloon and a few other small stones were delivered and we then proceeded with an occlusion cholangiogram in the customary fashion which was negative and there was adequate biliary drainage at the end of the procedure and the scope was removed after the wire and the balloon were removed and the patient tolerated the procedure well there was no obvious immediate  complication             COMPLICATIONS: none  ENDOSCOPIC IMPRESSION:1. Normal ampulla 2. No pancreatic duct injection or wire advancement 3. A few small stones removed after sphincterotomy as above 4. Negative occlusion cholangiogram and adequate biliary drainage at the end of the procedure  RECOMMENDATIONS:observe for delayed complications customary post-ERCP orders and if doing well after evening meal at 5:30 PMcould go home if okay with hospital and surgical team if not hopefully tomorrow and would recheck liver tests as an outpatient just to make sure they are back to normal     _______________________________ eSigned:  Vida RiggerMarc Jalicia Roszak, MD 09/25/2014 11:27 AM   CC:  PATIENT NAME:  Erin Figueroa, [name] MR#: [id number]

## 2014-09-25 NOTE — Transfer of Care (Signed)
Immediate Anesthesia Transfer of Care Note  Patient: Erin Figueroa   Procedure(s) Performed: Procedure(s): ENDOSCOPIC RETROGRADE CHOLANGIOPANCREATOGRAPHY (ERCP) WITH PROPOFOL (N/A)  Patient Location: PACU and Endoscopy Unit  Anesthesia Type:General  Level of Consciousness: awake, alert , oriented and sedated  Airway & Oxygen Therapy: Patient Spontanous Breathing and Patient connected to nasal cannula oxygen  Post-op Assessment: Report given to PACU RN, Post -op Vital signs reviewed and stable and Patient moving all extremities  Post vital signs: Reviewed and stable  Complications: No apparent anesthesia complications

## 2014-09-25 NOTE — Progress Notes (Signed)
<  principal problem not specified>  Subjective: Doing well post ERCP.  Denies any abd pain.  Tolerating clears.    Objective: Vital signs in last 24 hours: Temp:  [97.5 F (36.4 C)-98.6 F (37 C)] 98.6 F (37 C) (01/18 1139) Pulse Rate:  [50-89] 61 (01/18 1205) Resp:  [12-19] 19 (01/18 1205) BP: (102-142)/(52-83) 120/81 mmHg (01/18 1205) SpO2:  [93 %-100 %] 97 % (01/18 1205) Last BM Date: 09/23/14  Intake/Output from previous day: 01/17 0701 - 01/18 0700 In: 5100 [I.V.:5100] Out: 4550 [Urine:4550] Intake/Output this shift: Total I/O In: 200 [I.V.:200] Out: -   General appearance: alert and cooperative GI: normal findings: soft, non-tender Incision/Wound: clean, dry  Lab Results:  Results for orders placed or performed during the hospital encounter of 09/23/14 (from the past 24 hour(s))  CBC with Differential     Status: Abnormal   Collection Time: 09/25/14  5:10 AM  Result Value Ref Range   WBC 13.4 (H) 4.0 - 10.5 K/uL   RBC 4.36 3.87 - 5.11 MIL/uL   Hemoglobin 12.3 12.0 - 15.0 g/dL   HCT 28.437.7 13.236.0 - 44.046.0 %   MCV 86.5 78.0 - 100.0 fL   MCH 28.2 26.0 - 34.0 pg   MCHC 32.6 30.0 - 36.0 g/dL   RDW 10.212.9 72.511.5 - 36.615.5 %   Platelets 239 150 - 400 K/uL   Neutrophils Relative % 87 (H) 43 - 77 %   Neutro Abs 11.7 (H) 1.7 - 7.7 K/uL   Lymphocytes Relative 8 (L) 12 - 46 %   Lymphs Abs 1.1 0.7 - 4.0 K/uL   Monocytes Relative 5 3 - 12 %   Monocytes Absolute 0.7 0.1 - 1.0 K/uL   Eosinophils Relative 0 0 - 5 %   Eosinophils Absolute 0.0 0.0 - 0.7 K/uL   Basophils Relative 0 0 - 1 %   Basophils Absolute 0.0 0.0 - 0.1 K/uL  Comprehensive metabolic panel     Status: Abnormal   Collection Time: 09/25/14  5:10 AM  Result Value Ref Range   Sodium 138 135 - 145 mmol/L   Potassium 4.3 3.5 - 5.1 mmol/L   Chloride 107 96 - 112 mEq/L   CO2 23 19 - 32 mmol/L   Glucose, Bld 117 (H) 70 - 99 mg/dL   BUN 9 6 - 23 mg/dL   Creatinine, Ser 4.400.61 0.50 - 1.10 mg/dL   Calcium 8.7 8.4 - 34.710.5  mg/dL   Total Protein 6.7 6.0 - 8.3 g/dL   Albumin 3.7 3.5 - 5.2 g/dL   AST 52 (H) 0 - 37 U/L   ALT 179 (H) 0 - 35 U/L   Alkaline Phosphatase 164 (H) 39 - 117 U/L   Total Bilirubin 0.7 0.3 - 1.2 mg/dL   GFR calc non Af Amer >90 >90 mL/min   GFR calc Af Amer >90 >90 mL/min   Anion gap 8 5 - 15     Studies/Results Radiology     MEDS, Scheduled     Assessment: <principal problem not specified> S/p cholecystectomy and ERCP/sphincterotomy  Plan: Doing well.  Will advance diet and possibly d/c this evening, if she tolerates this    LOS: 2 days    Vanita PandaAlicia C Briannie Gutierrez, MD [facility], [place] [phone]   09/25/2014 1:59 PM

## 2014-09-26 ENCOUNTER — Encounter (HOSPITAL_COMMUNITY): Payer: Self-pay | Admitting: Gastroenterology

## 2014-09-27 NOTE — Discharge Summary (Signed)
Physician Discharge Summary  Patient ID: Erin Figueroa MRN: [id number] DOB/AGE: 26/09/1990 23 y.o.  Admit date: 09/23/2014 Discharge date: 09/25/2014  Admission Diagnoses:  Discharge Diagnoses:  Active Problems:   Gall bladder disease   Discharged Condition: good  Hospital Course: Patient admitted for cholecystectomy.  An obstructing stone was found in her bile duct.  This was removed by ERCP.  She was discharged to home after this.    Consults: GI  Significant Diagnostic Studies: labs: cbc, chemistry and endoscopy: ERCP: obstructing stone removed via sphincterotomy.    Treatments: IV hydration, analgesia: acetaminophen w/ codeine, procedures: ERCP and surgery: lap chole  Discharge Exam: Blood pressure 140/78, pulse 69, temperature 98.1 F (36.7 C), temperature source Oral, resp. rate 18, height 5\' 2"  (1.575 m), weight 210 lb (95.255 kg), SpO2 96 %. General appearance: alert and cooperative GI: normal findings: soft, non-tender Incision/Wound: clean  Disposition: 01-Home or Self Care     Medication List    TAKE these medications        amoxicillin 875 MG tablet  Commonly known as:  AMOXIL  Take 1 tablet (875 mg total) by mouth 2 (two) times daily.     HYDROcodone-acetaminophen 5-325 MG per tablet  Commonly known as:  NORCO/VICODIN  Take 1-2 tablets by mouth every 4 (four) hours as needed for moderate pain.     ibuprofen 200 MG tablet  Commonly known as:  ADVIL,MOTRIN  Take 400 mg by mouth every 6 (six) hours as needed for mild pain.     magic mouthwash Soln  Take 5 mLs by mouth 4 (four) times daily as needed for mouth pain.     naproxen 500 MG tablet  Commonly known as:  NAPROSYN  Take 1 tablet (500 mg total) by mouth 2 (two) times daily with a meal.           Follow-up Information    Follow up with NEWMAN,DAVID H, MD. Schedule an appointment as soon as possible for a visit in 2 weeks.   Specialty:  General Surgery   Contact information:   [address] [place] [address] [phone]       [facility], [facility]. Schedule an appointment as soon as possible for a visit in 2 weeks.   Specialty:  Gastroenterology   Contact information:   [address] [place] [address] [phone]       Signed: Vanita PandaHOMAS, Essie Lagunes C. 09/27/2014, 2:15 PM

## 2014-09-28 ENCOUNTER — Encounter (HOSPITAL_COMMUNITY): Payer: Self-pay | Admitting: Surgery

## 2015-09-30 IMAGING — RF DG CHOLANGIOGRAM OPERATIVE
1 series · 4 of 4 positions shown · non-contrast
Comparison: Abdominal ultrasound yesterday.

CLINICAL DATA: Cholelithiasis.  Laparoscopic cholecystectomy.

EXAM:
INTRAOPERATIVE CHOLANGIOGRAM
TECHNIQUE: Cholangiographic images from the C-arm fluoroscopic device were
submitted for interpretation post-operatively. Please see the
procedural report for the amount of contrast and the fluoroscopy
time utilized.

[Series 1: run · 4 of 101 frames shown]
[frame 16/101]
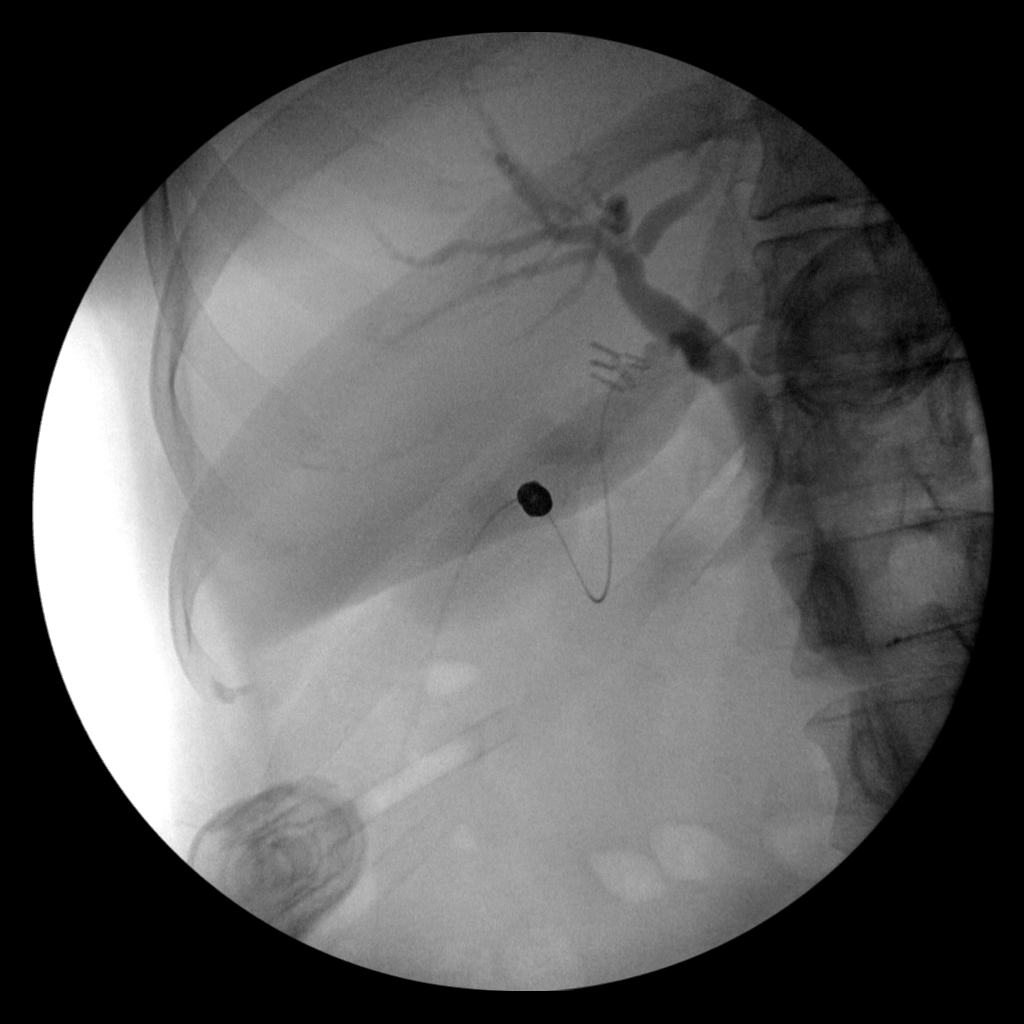
[frame 51/101]
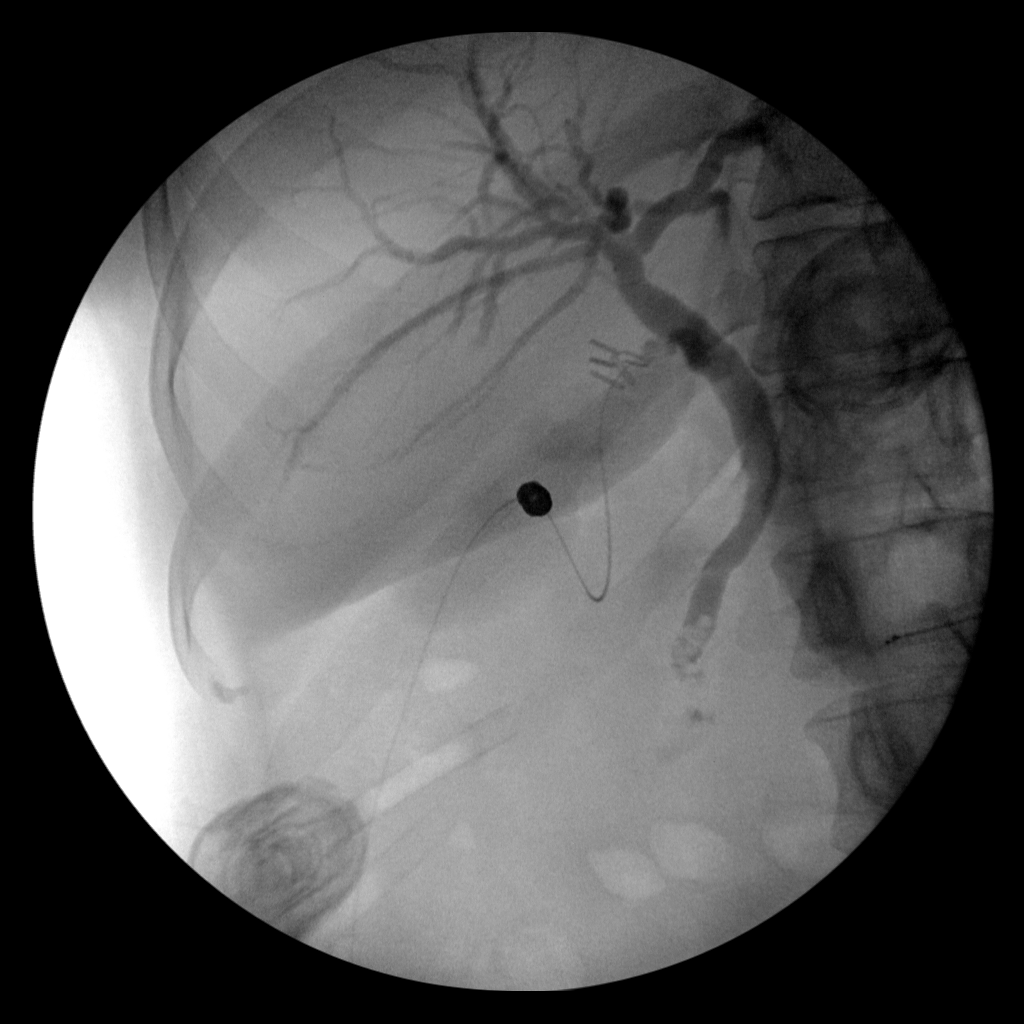
[frame 86/101]
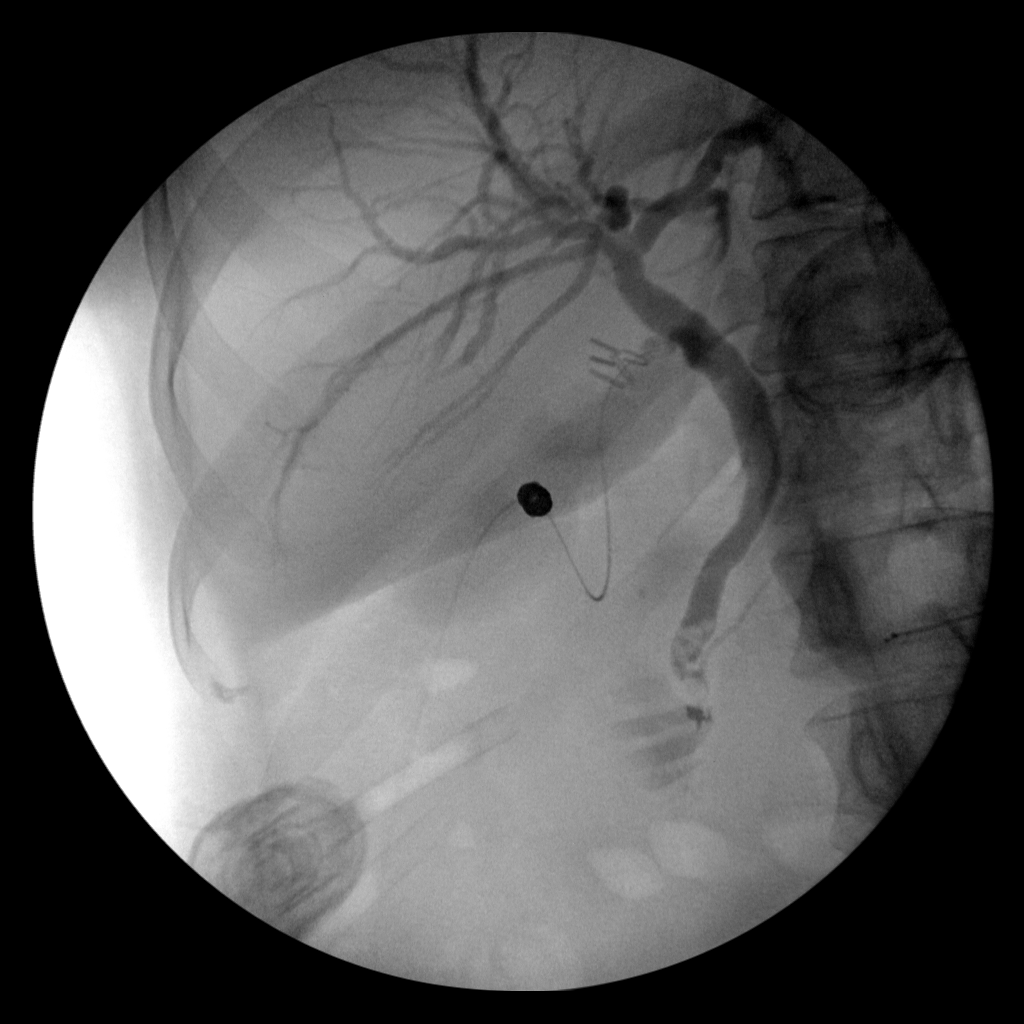
[frame 98/101]
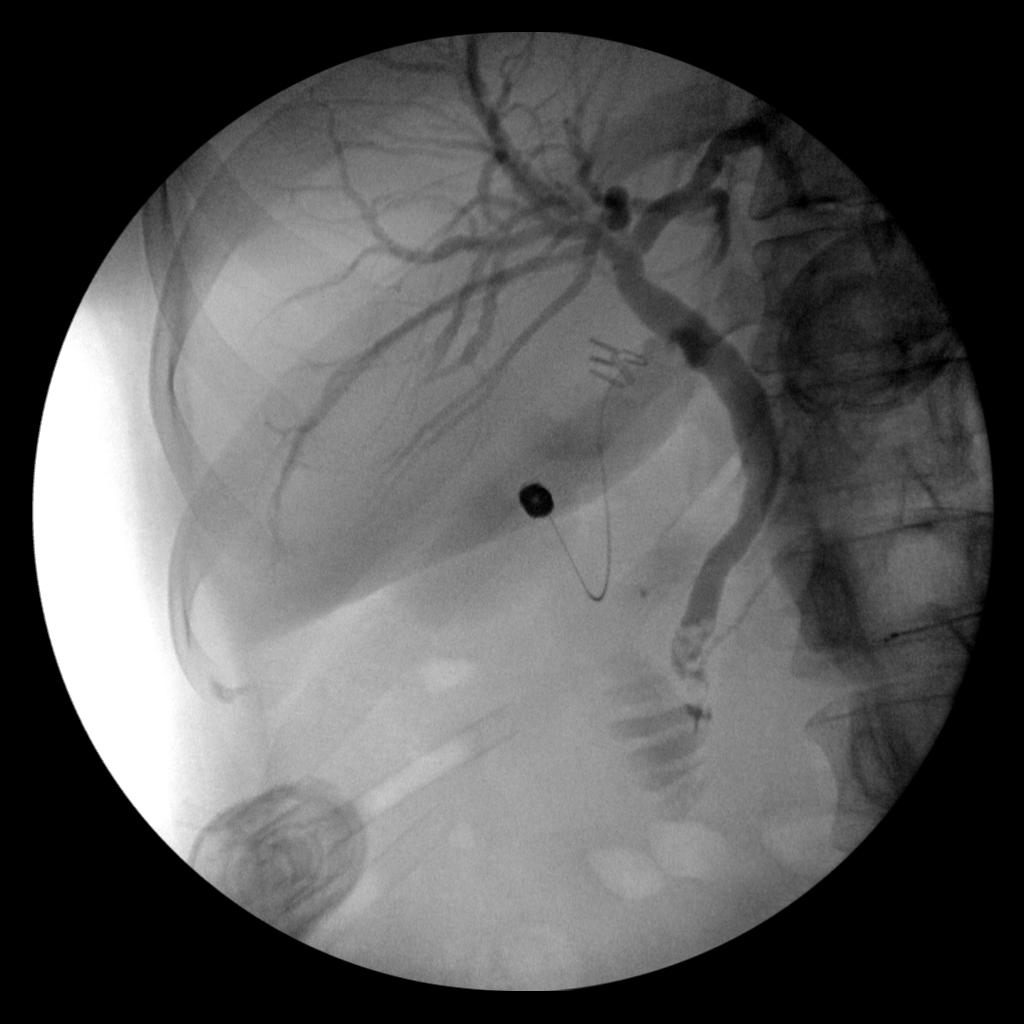

[4 of 4 positions shown; findings below may reference images not displayed]

FINDINGS: At least 4 filling defects in the distal common bile duct, the
largest just above the ampulla, consistent with bile duct stones.
These are nonobstructive, as the contrast material enters the
duodenum and there is no biliary ductal dilation. No filling defects
elsewhere in the biliary tree.
IMPRESSION: At least 4 nonobstructing gallstones in the distal common bile duct.

## 2015-10-01 IMAGING — RF DG ERCP WO/W SPHINCTEROTOMY
1 series · 2 of 2 positions shown · non-contrast
Comparison: Intraoperative cholangiogram 09/24/2014

CLINICAL DATA: 23-year-old female with choledocholithiasis

EXAM:
ERCP sphincterotomy and stone manipulation
TECHNIQUE: Multiple spot images obtained with the fluoroscopic device and
submitted for interpretation post-procedure.

[Series 1: run · 2 of 2 slices shown]
[im 1/2]
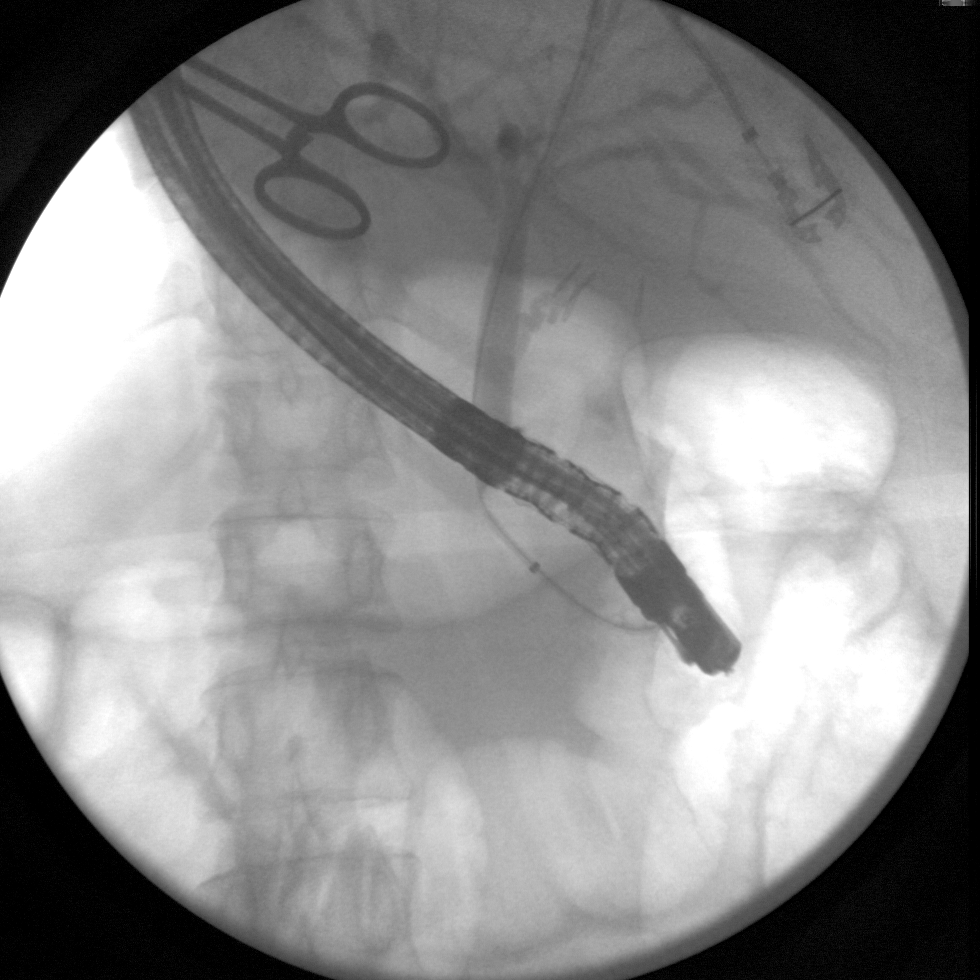
[im 2/2]
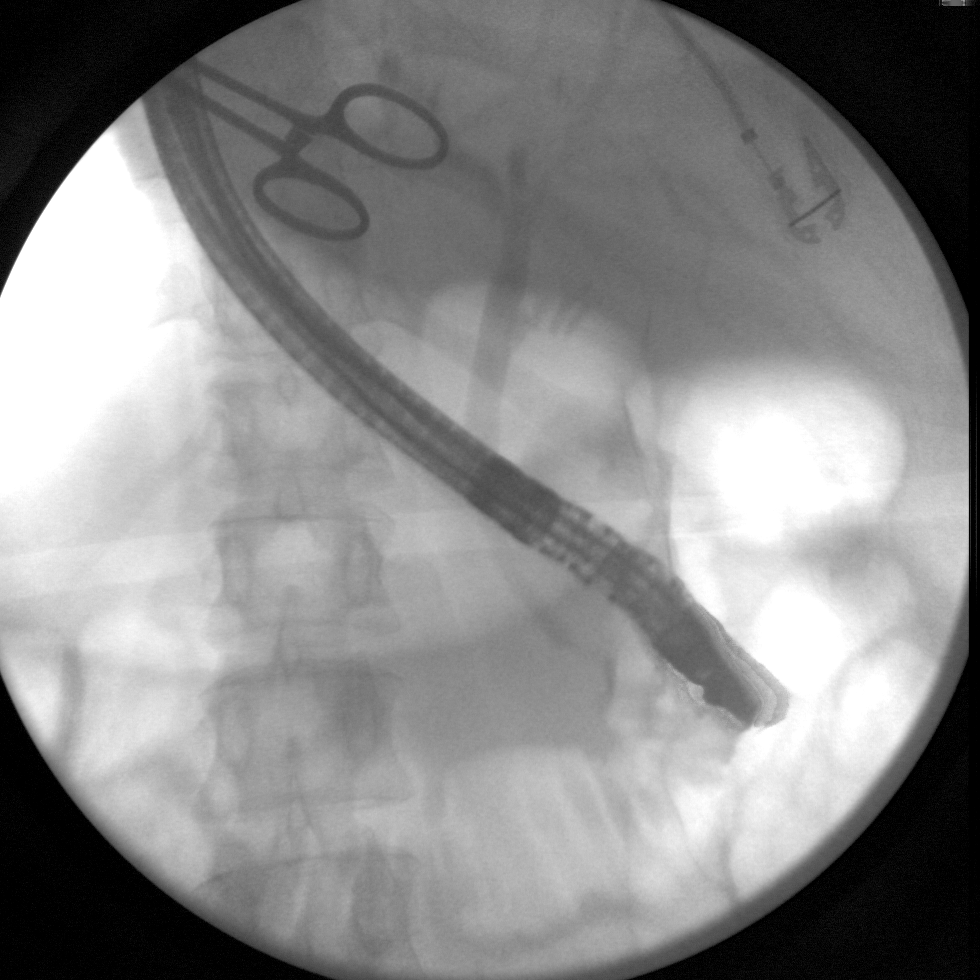

[2 of 2 positions shown; findings below may reference images not displayed]

FINDINGS: Two images demonstrate a flexible endoscope in the duodenum with a
cannulation of the common bile duct. The wire passes into the
intrahepatic ducts. Following manipulation, no definite residual
filling defects are identified.
IMPRESSION: ERCP with sphincterotomy and stone manipulation.

These images were submitted for radiologic interpretation only.
Please see the procedural report for the amount of contrast and the
fluoroscopy time utilized.

## 2017-06-09 ENCOUNTER — Ambulatory Visit (INDEPENDENT_AMBULATORY_CARE_PROVIDER_SITE_OTHER): Payer: BLUE CROSS/BLUE SHIELD | Admitting: Family Medicine

## 2017-06-09 ENCOUNTER — Encounter: Payer: Self-pay | Admitting: Family Medicine

## 2017-06-09 VITALS — BP 127/89 | HR 88 | Temp 97.8°F | Ht 62.0 in | Wt 216.0 lb

## 2017-06-09 DIAGNOSIS — R310 Gross hematuria: Secondary | ICD-10-CM | POA: Diagnosis not present

## 2017-06-09 DIAGNOSIS — Z7689 Persons encountering health services in other specified circumstances: Secondary | ICD-10-CM | POA: Diagnosis not present

## 2017-06-09 DIAGNOSIS — N912 Amenorrhea, unspecified: Secondary | ICD-10-CM | POA: Diagnosis not present

## 2017-06-09 DIAGNOSIS — N3001 Acute cystitis with hematuria: Secondary | ICD-10-CM

## 2017-06-09 LAB — MICROSCOPIC EXAMINATION
Epithelial Cells (non renal): NONE SEEN /hpf (ref 0–10)
RBC, UA: >30 /hpf — AB (ref 0–?)
Renal Epithel, UA: NONE SEEN /hpf
WBC, UA: >30 /hpf — AB (ref 0–?)

## 2017-06-09 LAB — URINALYSIS, COMPLETE
Bilirubin, UA: NEGATIVE
GLUCOSE, UA: NEGATIVE
Ketones, UA: NEGATIVE
Nitrite, UA: NEGATIVE
Specific Gravity, UA: 1.025 (ref 1.005–1.030)
Urobilinogen, Ur: 0.2 mg/dL (ref 0.2–1.0)
pH, UA: 6 (ref 5.0–7.5)

## 2017-06-09 LAB — PREGNANCY, URINE: Preg Test, Ur: NEGATIVE

## 2017-06-09 MED ORDER — CEFTRIAXONE SODIUM 1 G IJ SOLR
1.0000 g | Freq: Once | INTRAMUSCULAR | Status: AC
  Administered 2017-06-09: 1 g via INTRAMUSCULAR

## 2017-06-09 MED ORDER — CEPHALEXIN 500 MG PO CAPS: 500.0000 mg | ORAL_CAPSULE | Freq: Four times a day (QID) | ORAL | 0 refills | Status: AC

## 2017-06-09 NOTE — Addendum Note (Signed)
Addended byDory Peru on: 06/09/2017 09:19 AM   Modules accepted: Orders

## 2017-06-09 NOTE — Progress Notes (Signed)
Subjective: LH:TDSKAJG symptoms, re-establish care PCP: Dettinger, Fransisca Kaufmann, MD OTL:XBWIO Erin Figueroa is a 26 y.o. female presenting to clinic today for:  1. Urinary symptoms Patient reports onset of symptoms on Sunday. She describes dysuria, urinary frequency, urinary urgency. She reports subjective fevers and chills. She notes that symptoms seem to get worse on Monday evening and this is when she started taking AZO for symptoms. She reports improvement in dysuria on the AZO. Last dose was this morning around 2 AM. She is that she also took one dose of Norco this morning to see if this would help her with the pain and with sleeping. She does not have nausea, vomiting. She reports frank blood in the toilet and with wiping. She reports a history of recurrent UTIs in the past but notes that this is the first she is had this year. No bleeding from any other orifice. Additionally, she has seen a line serology in the past somewhere around 2015. She does not recall any further recommendations during that visit. Last menstrual period was about 2 months ago. She reports that her periods are irregular. No vaginal discharge or back pain. Patient has no personal history of smoking.  Allergies  Allergen Reactions  . Ciprofloxacin     itching  . Sulfa Antibiotics Nausea Only   History reviewed. No pertinent past medical history. Family History  Problem Relation Age of Onset  . Depression Mother   . Bipolar disorder Mother   . Hypertension Mother   . Diabetes Father   . Hypertension Father   . Hypertension Maternal Grandmother   . Cancer Maternal Grandfather   . Heart attack Paternal Grandmother 62  . Heart attack Paternal Grandfather    Social Hx: never smoker.Current medications reviewed.   ROS: Per HPI  Objective: Office vital signs reviewed. BP 127/89   Pulse 88   Temp 97.8 F (36.6 C) (Oral)   Ht '5\' 2"'  (1.575 m)   Wt 216 lb (98 kg)   BMI 39.51 kg/m   Physical Examination:    General: Awake, alert, obese, No acute distress HEENT: conjunctival pallor appreciated Cardio: regular rate and rhythm, S1S2 heard, no murmurs appreciated Pulm: clear to auscultation bilaterally, no wheezes, rhonchi or rales; normal work of breathing on room air GI: soft, non-tender, non-distended, bowel sounds present x4, no hepatomegaly, no splenomegaly, no masses GU: +suprapubic TTP, no CVA TTP  No results found for this or any previous visit (from the past 24 hour(s)).  Assessment/ Plan: 26 y.o. female   1. Acute cystitis with hematuria Urinalysis with blood and leukocytes. Nitrite negative. There were large amounts of RBCs and WBCs on microscopy. Urine culture was sent. Will contact patient if pathogen is resistant to current antibiotic. She was given 1 dose of Rocephin 1 g IM today in office. She was discharged with Keflex 500 mg 4 times a day for the next 10 days. We reviewed use of this medication. She may continue AZO for the next 2 days. I encouraged oral hydration. We'll obtain CBC to evaluate for evidence of pyelonephritis and to look at hemoglobin level given report of gross hematuria. CMP also obtained to look at renal function. Should return precautions were reviewed with the patient. She was given understanding. She'll follow up in the next couple of weeks for a full physical exam with Pap smear. - Urinalysis, Complete - Urine Culture - CBC with Differential - cephALEXin (KEFLEX) 500 MG capsule; Take 1 capsule (500 mg total) by mouth 4 (four) times  daily.  Dispense: 40 capsule; Refill: 0  2. Hematuria, gross We'll plan for repeat urinalysis during physical exam. If persistent hematuria, will refer back to [facility] for further assessment. Risk factors for bladder cancer include obesity. She is a nonsmoker. - Urinalysis, Complete - Urine Culture - CBC with Differential - CMP14+EGFR  3. Encounter to establish care with new doctor  4. Amenorrhea Per her report it  sounds like she has PCOS. We'll discuss this further during next visit. - Pregnancy, urine   Orders Placed This Encounter  Procedures  . Urine Culture  . Urinalysis, Complete  . CBC with Differential  . CMP14+EGFR  . Pregnancy, urine   Meds ordered this encounter  Medications  . cephALEXin (KEFLEX) 500 MG capsule    Sig: Take 1 capsule (500 mg total) by mouth 4 (four) times daily.    Dispense:  40 capsule    Refill:  Jardine, DO [place] [phone]

## 2017-06-09 NOTE — Patient Instructions (Addendum)
Your urinalysis showed evidence of infection and gross blood. I sent this off for culture. In the meantime, he was treated with one dose of Rocephin. I have also prescribed oral antibiotics free to take for the next 10 days. You will take this 4 times a day. You may continue the AZO for the next 2 days. After that, I want to discontinue the AZO so as not to mask your symptoms if they are persistent. Drink plenty of water. If your symptoms worsen, you develop high fevers, nausea, vomiting, abdominal pain, rash or are unable to stay hydrated, please seek immediate medical attention. Otherwise, plan to follow up with me in the next couple of weeks for full physical exam with Pap smear if you are due for this.   Urinary Tract Infection, Adult A urinary tract infection (UTI) is an infection of any part of the urinary tract. The urinary tract includes the:  Kidneys.  Ureters.  Bladder.  Urethra.  These organs make, store, and get rid of pee (urine) in the body. Follow these instructions at home:  Take over-the-counter and prescription medicines only as told by your doctor.  If you were prescribed an antibiotic medicine, take it as told by your doctor. Do not stop taking the antibiotic even if you start to feel better.  Avoid the following drinks: ? Alcohol. ? Caffeine. ? Tea. ? Carbonated drinks.  Drink enough fluid to keep your pee clear or pale yellow.  Keep all follow-up visits as told by your doctor. This is important.  Make sure to: ? Empty your bladder often and completely. Do not to hold pee for long periods of time. ? Empty your bladder before and after sex. ? Wipe from front to back after a bowel movement if you are female. Use each tissue one time when you wipe. Contact a doctor if:  You have back pain.  You have a fever.  You feel sick to your stomach (nauseous).  You throw up (vomit).  Your symptoms do not get better after 3 days.  Your symptoms go away and then  come back. Get help right away if:  You have very bad back pain.  You have very bad lower belly (abdominal) pain.  You are throwing up and cannot keep down any medicines or water. This information is not intended to replace advice given to you by your health care provider. Make sure you discuss any questions you have with your health care provider. Document Released: 02/11/2008 Document Revised: 01/31/2016 Document Reviewed: 07/16/2015 Elsevier Interactive Patient Education  [employer].

## 2017-06-10 ENCOUNTER — Encounter: Payer: Self-pay | Admitting: Family Medicine

## 2017-06-11 LAB — URINE CULTURE: ORGANISM ID, BACTERIA: NO GROWTH

## 2017-12-15 ENCOUNTER — Encounter: Payer: Self-pay | Admitting: Family Medicine

## 2017-12-15 ENCOUNTER — Ambulatory Visit (INDEPENDENT_AMBULATORY_CARE_PROVIDER_SITE_OTHER): Payer: Self-pay | Admitting: Family Medicine

## 2017-12-15 VITALS — BP 132/92 | HR 83 | Temp 98.7°F | Ht 62.0 in | Wt 218.0 lb

## 2017-12-15 DIAGNOSIS — I493 Ventricular premature depolarization: Secondary | ICD-10-CM

## 2017-12-15 DIAGNOSIS — R002 Palpitations: Secondary | ICD-10-CM

## 2017-12-15 DIAGNOSIS — N926 Irregular menstruation, unspecified: Secondary | ICD-10-CM

## 2017-12-15 LAB — PREGNANCY, URINE: Preg Test, Ur: NEGATIVE

## 2017-12-15 MED ORDER — METOPROLOL TARTRATE 25 MG PO TABS: 12.5000 mg | ORAL_TABLET | Freq: Two times a day (BID) | ORAL | 0 refills | Status: DC

## 2017-12-15 NOTE — Patient Instructions (Addendum)
We obtained an EKG today that showed premature ventricular contractions (PVC) We are obtaining labs to check your electrolytes, kidney function, liver function, for anemia and thyroid disorder. I would like you to continue the below modifications.  I have prescribed you metoprolol to take 1/2 tablet twice a day.  I would like you to follow-up with me in about 2 weeks to let me know if this is working for you.  If symptoms persist or become more frequent we should consider referring you to the cardiologist to look for structural issues with a heart.  If you start having dizziness, shortness of breath or any other worrisome symptoms or signs, please seek immediate medical attention in the emergency department.  Palpitations A palpitation is the feeling that your heartbeat is irregular or is faster than normal. It may feel like your heart is fluttering or skipping a beat. Palpitations are usually not a serious problem. They may be caused by many things, including smoking, caffeine, alcohol, stress, and certain medicines. Although most causes of palpitations are not serious, palpitations can be a sign of a serious medical problem. In some cases, you may need further medical evaluation. Follow these instructions at home: Pay attention to any changes in your symptoms. Take these actions to help with your condition:  Avoid the following: ? Caffeinated coffee, tea, soft drinks, diet pills, and energy drinks. ? Chocolate. ? Alcohol.  Do not use any tobacco products, such as cigarettes, chewing tobacco, and e-cigarettes. If you need help quitting, ask your health care provider.  Try to reduce your stress and anxiety. Things that can help you relax include: ? Yoga. ? Meditation. ? Physical activity, such as swimming, jogging, or walking. ? Biofeedback. This is a method that helps you learn to use your mind to control things in your body, such as your heartbeats.  Get plenty of rest and sleep.  Take  over-the-counter and prescription medicines only as told by your health care provider.  Keep all follow-up visits as told by your health care provider. This is important.  Contact a health care provider if:  You continue to have a fast or irregular heartbeat after 24 hours.  Your palpitations occur more often. Get help right away if:  You have chest pain or shortness of breath.  You have a severe headache.  You feel dizzy or you faint. This information is not intended to replace advice given to you by your health care provider. Make sure you discuss any questions you have with your health care provider. Document Released: 08/22/2000 Document Revised: 01/28/2016 Document Reviewed: 05/10/2015 Elsevier Interactive Patient Education  [employer]2018 [employer]

## 2017-12-15 NOTE — Progress Notes (Signed)
Subjective: CC: Heart palpitations HPI: Erin Figueroa is a 27 y.o. female presenting to clinic today for:  1. Heart palpitations Patient reports new onset heart palpitations, she notes that she feels like her "heart is skipping a beat", that started on Thursday.  She felt like it started as a flutter in her throat on Thursday and then by Friday evening it was occurring and increased frequency.  It has been occurring daily since that time.  She reports that it is intermittent but is present more often than not.  She notes that she has been reducing caffeine intake and hydrating with only water in efforts to reduce symptoms.  She does note quite a bit of stress.  Denies any abnormal bleeding.  She reports her periods are actually very irregular and her last period was a couple of months ago.  Denies any chest pain, shortness of breath, dizziness.  She is eating and drinking normally.  Both her father and sister have had a history of palpitations and are on beta-blockers.  She is unable to identify why they have palpitations.  2. Amenorrhea Patient notes that she is always had irregular menstrual cycles.  Her last menstrual cycle was sometime in January.  She is married and does have intercourse.  Though she is confident that she is not pregnant, she required medications in order to get pregnant with her last child.  ROS: Per HPI  No past medical history on file.   Allergies  Allergen Reactions  . Ciprofloxacin     itching  . Sulfa Antibiotics Nausea Only   No current outpatient medications on file. Social History   Socioeconomic History  . Marital status: Married    Spouse name: Not on file  . Number of children: Not on file  . Years of education: Not on file  . Highest education level: Not on file  Occupational History  . Not on file  Social Needs  . Financial resource strain: Not on file  . Food insecurity:    Worry: Not on file    Inability: Not on file  . Transportation  needs:    Medical: Not on file    Non-medical: Not on file  Tobacco Use  . Smoking status: Never Smoker  . Smokeless tobacco: Never Used  Substance and Sexual Activity  . Alcohol use: No  . Drug use: No  . Sexual activity: Yes    Birth control/protection: None  Lifestyle  . Physical activity:    Days per week: Not on file    Minutes per session: Not on file  . Stress: Not on file  Relationships  . Social connections:    Talks on phone: Not on file    Gets together: Not on file    Attends religious service: Not on file    Active member of club or organization: Not on file    Attends meetings of clubs or organizations: Not on file    Relationship status: Not on file  . Intimate partner violence:    Fear of current or ex partner: Not on file    Emotionally abused: Not on file    Physically abused: Not on file    Forced sexual activity: Not on file  Other Topics Concern  . Not on file  Social History Narrative  . Not on file   Family History  Problem Relation Age of Onset  . Depression Mother   . Bipolar disorder Mother   . Hypertension Mother   .  Diabetes Father   . Hypertension Father   . Hypertension Maternal Grandmother   . Cancer Maternal Grandfather   . Heart attack Paternal Grandmother 33  . Heart attack Paternal Grandfather     Health Maintenance: Pap, TDap, HIV screen  Objective: Office vital signs reviewed. BP (!) 132/92   Pulse 83   Temp 98.7 F (37.1 C) (Oral)   Ht _0  (1.575 m)   Wt 218 lb (98.9 kg)   BMI 39.87 kg/m   Physical Examination:  General: Awake, alert, obese, No acute distress HEENT: Normal    Neck: No masses palpated. No lymphadenopathy; Neck girth enlarged but no goiter.    Eyes: PERRLA, extraocular movement in tact, sclera white; no exophthalmos.    Throat: moist mucus membranes Cardio: regular rate and rhythm, S1S2 heard, no murmurs appreciated; occasional skipped beat appreciated. Pulm: clear to auscultation bilaterally, no  wheezes, rhonchi or rales; normal work of breathing on room air  EKG: occasional PVC noted  Assessment/ Plan: 27 y.o. female   1. Heart palpitations Occasionally PVC noted on EKG.  I suspect that she is having symptomatic PVCs.  Over the course of 1 minute, 4 PVCs were noted.  Family history of symptomatic palpitations as above.  Will obtain metabolic panel to further evaluate and rule out metabolic etiology.  Check urine pregnancy since no period since January.Home care instructions were reviewed.  Continue to avoid caffeine and reduce stress.  Metoprolol 12.5 mg p.o. twice daily prescribed.  She will follow-up in 2 weeks with me for recheck. - CMP14+EGFR - CBC with Differential - TSH - EKG 12-Lead  2. Symptomatic PVCs Seen on EKG. I did discuss with patient that if symptoms were to become more frequent or more severe that we would need to consider evaluation by cardiology to rule out structural heart defects.  If she were to start having other associated symptoms she should seek immediate medical attention in the emergency department.  3. Irregular menstrual cycle Difficult to know why she has had a regular menstrual cycles.  ?  PCOS.  Check TSH as above.  Check urine pregnancy. - Pregnancy, urine   Janora Norlander, DO [place] [phone]

## 2017-12-16 LAB — CBC WITH DIFFERENTIAL/PLATELET
BASOS: 0 %
Basophils Absolute: 0.1 10*3/uL (ref 0.0–0.2)
EOS (ABSOLUTE): 0.2 10*3/uL (ref 0.0–0.4)
EOS: 1 %
Hematocrit: 41.4 % (ref 34.0–46.6)
Hemoglobin: 13.8 g/dL (ref 11.1–15.9)
IMMATURE GRANS (ABS): 0 10*3/uL (ref 0.0–0.1)
Immature Granulocytes: 0 %
LYMPHS: 25 %
Lymphocytes Absolute: 3 10*3/uL (ref 0.7–3.1)
MCH: 28.2 pg (ref 26.6–33.0)
MCHC: 33.3 g/dL (ref 31.5–35.7)
MCV: 85 fL (ref 79–97)
Monocytes Absolute: 0.7 10*3/uL (ref 0.1–0.9)
Monocytes: 6 %
NEUTROS ABS: 8.2 10*3/uL — AB (ref 1.4–7.0)
NEUTROS PCT: 68 %
Platelets: 301 10*3/uL (ref 150–379)
RBC: 4.9 x10E6/uL (ref 3.77–5.28)
RDW: 13.4 % (ref 12.3–15.4)
WBC: 12.1 10*3/uL — ABNORMAL HIGH (ref 3.4–10.8)

## 2017-12-16 LAB — CMP14+EGFR
A/G RATIO: 1.6 (ref 1.2–2.2)
ALT: 30 IU/L (ref 0–32)
AST: 21 IU/L (ref 0–40)
Albumin: 4.5 g/dL (ref 3.5–5.5)
Alkaline Phosphatase: 92 IU/L (ref 39–117)
BUN/Creatinine Ratio: 16 (ref 9–23)
BUN: 12 mg/dL (ref 6–20)
Bilirubin Total: 0.2 mg/dL (ref 0.0–1.2)
CALCIUM: 9.5 mg/dL (ref 8.7–10.2)
CO2: 22 mmol/L (ref 20–29)
CREATININE: 0.76 mg/dL (ref 0.57–1.00)
Chloride: 101 mmol/L (ref 96–106)
GFR, EST AFRICAN AMERICAN: 123 mL/min/{1.73_m2} (ref >59)
GFR, EST NON AFRICAN AMERICAN: 107 mL/min/{1.73_m2} (ref >59)
GLUCOSE: 78 mg/dL (ref 65–99)
Globulin, Total: 2.9 g/dL (ref 1.5–4.5)
Potassium: 4.2 mmol/L (ref 3.5–5.2)
Sodium: 138 mmol/L (ref 134–144)
TOTAL PROTEIN: 7.4 g/dL (ref 6.0–8.5)

## 2017-12-16 LAB — TSH: TSH: 0.745 u[IU]/mL (ref 0.450–4.500)

## 2017-12-31 ENCOUNTER — Ambulatory Visit: Payer: Self-pay | Admitting: Family Medicine

## 2018-01-06 ENCOUNTER — Encounter: Payer: Self-pay | Admitting: Family Medicine

## 2018-01-07 ENCOUNTER — Ambulatory Visit: Payer: Self-pay | Admitting: Family Medicine

## 2018-01-07 ENCOUNTER — Other Ambulatory Visit: Payer: Self-pay | Admitting: Family Medicine

## 2018-01-07 MED ORDER — METOPROLOL TARTRATE 25 MG PO TABS: 12.5000 mg | ORAL_TABLET | Freq: Two times a day (BID) | ORAL | 1 refills | Status: DC

## 2018-01-13 NOTE — Progress Notes (Deleted)
Subjective: CC: follow up symptomatic PVCs HPI: Erin Figueroa is a 27 y.o. female presenting to clinic today for:  1. PVCs Patient started on metoprolol 12.5 milligrams p.o. twice daily 1 month ago.  She is here today for follow-up on symptom medic PVCs.  She notes that she has been doing ***   ROS: Per HPI  No past medical history on file. Allergies  Allergen Reactions  . Ciprofloxacin     itching  . Sulfa Antibiotics Nausea Only    Current Outpatient Medications:  .  metoprolol tartrate (LOPRESSOR) 25 MG tablet, Take 0.5 tablets (12.5 mg total) by mouth 2 (two) times daily., Disp: 30 tablet, Rfl: 1 Social History   Socioeconomic History  . Marital status: Married    Spouse name: Not on file  . Number of children: Not on file  . Years of education: Not on file  . Highest education level: Not on file  Occupational History  . Not on file  Social Needs  . Financial resource strain: Not on file  . Food insecurity:    Worry: Not on file    Inability: Not on file  . Transportation needs:    Medical: Not on file    Non-medical: Not on file  Tobacco Use  . Smoking status: Never Smoker  . Smokeless tobacco: Never Used  Substance and Sexual Activity  . Alcohol use: No  . Drug use: No  . Sexual activity: Yes    Birth control/protection: None  Lifestyle  . Physical activity:    Days per week: Not on file    Minutes per session: Not on file  . Stress: Not on file  Relationships  . Social connections:    Talks on phone: Not on file    Gets together: Not on file    Attends religious service: Not on file    Active member of club or organization: Not on file    Attends meetings of clubs or organizations: Not on file    Relationship status: Not on file  . Intimate partner violence:    Fear of current or ex partner: Not on file    Emotionally abused: Not on file    Physically abused: Not on file    Forced sexual activity: Not on file  Other Topics Concern  . Not  on file  Social History Narrative  . Not on file   Family History  Problem Relation Age of Onset  . Depression Mother   . Bipolar disorder Mother   . Hypertension Mother   . Diabetes Father   . Hypertension Father   . Hypertension Maternal Grandmother   . Cancer Maternal Grandfather   . Heart attack Paternal Grandmother 59  . Heart attack Paternal Grandfather     Health Maintenance: ***  Objective: Office vital signs reviewed. There were no vitals taken for this visit.  Physical Examination:  General: Awake, alert, *** nourished, No acute distress HEENT: Normal    Neck: No masses palpated. No lymphadenopathy    Ears: Tympanic membranes intact, normal light reflex, no erythema, no bulging    Eyes: PERRLA, extraocular movement in tact, sclera ***    Nose: nasal turbinates moist, *** nasal discharge    Throat: moist mucus membranes, no erythema, *** tonsillar exudate.  Airway is patent Cardio: regular rate and rhythm, S1S2 heard, no murmurs appreciated Pulm: clear to auscultation bilaterally, no wheezes, rhonchi or rales; normal work of breathing on room air GI: soft, non-tender, non-distended, bowel sounds  present x4, no hepatomegaly, no splenomegaly, no masses GU: external vaginal tissue ***, cervix ***, *** punctate lesions on cervix appreciated, *** discharge from cervical os, *** bleeding, *** cervical motion tenderness, *** abdominal/ adnexal masses Extremities: warm, well perfused, No edema, cyanosis or clubbing; +*** pulses bilaterally MSK: *** gait and *** station Skin: dry; intact; no rashes or lesions Neuro: *** Strength and light touch sensation grossly intact, *** DTRs ***/4  Assessment/ Plan: 27 y.o. female   No problem-specific Assessment & Plan notes found for this encounter.   Raliegh Ip, DO [facility] [phone]

## 2018-01-15 ENCOUNTER — Ambulatory Visit: Payer: Self-pay | Admitting: Family Medicine

## 2018-01-16 ENCOUNTER — Encounter: Payer: Self-pay | Admitting: Family Medicine

## 2019-06-08 ENCOUNTER — Other Ambulatory Visit: Payer: Self-pay

## 2019-06-08 DIAGNOSIS — Z20822 Contact with and (suspected) exposure to covid-19: Secondary | ICD-10-CM

## 2019-06-09 LAB — NOVEL CORONAVIRUS, NAA: SARS-CoV-2, NAA: NOT DETECTED

## 2019-06-16 ENCOUNTER — Other Ambulatory Visit: Payer: Self-pay

## 2019-06-16 DIAGNOSIS — Z20822 Contact with and (suspected) exposure to covid-19: Secondary | ICD-10-CM

## 2019-06-18 LAB — NOVEL CORONAVIRUS, NAA: SARS-CoV-2, NAA: NOT DETECTED

## 2019-11-03 ENCOUNTER — Other Ambulatory Visit: Payer: Self-pay

## 2019-11-03 ENCOUNTER — Ambulatory Visit (INDEPENDENT_AMBULATORY_CARE_PROVIDER_SITE_OTHER): Payer: BC Managed Care – PPO | Admitting: Family Medicine

## 2019-11-03 ENCOUNTER — Encounter: Payer: Self-pay | Admitting: Family Medicine

## 2019-11-03 VITALS — BP 145/102 | HR 77 | Temp 98.7°F | Ht 62.0 in | Wt 226.0 lb

## 2019-11-03 DIAGNOSIS — N766 Ulceration of vulva: Secondary | ICD-10-CM | POA: Diagnosis not present

## 2019-11-03 MED ORDER — VALACYCLOVIR HCL 1 G PO TABS: 1000.0000 mg | ORAL_TABLET | Freq: Two times a day (BID) | ORAL | 0 refills | Status: AC

## 2019-11-03 NOTE — Progress Notes (Signed)
   Assessment & Plan:  1. Ulceration of vulva - Education provided on genital herpes.  Patient is very upset as her husband is the only person she has ever been with sexually and they have been together for 14 years.  She does not believe he has cheated either.  We discussed that the virus can lay dormant for many years. - Herpes simplex virus culture - valACYclovir (VALTREX) 1000 MG tablet; Take 1 tablet (1,000 mg total) by mouth every 12 (twelve) hours for 10 days.  Dispense: 20 tablet; Refill: 0   Follow up plan: Return if symptoms worsen or fail to improve.  Deliah Boston, MSN, APRN, FNP-C [facility]  Subjective:   Patient ID: Erin Figueroa, female    DOB: 10-20-1990, 29 y.o.   MRN: [id number]  HPI: Erin Figueroa is a 29 y.o. female presenting on 11/03/2019 for Vaginal Pain  Patient comes in today with reports that she cut herself shaving the other day and believes it is now infected.   ROS: Negative unless specifically indicated above in HPI.   Relevant past medical history reviewed and updated as indicated.   Allergies and medications reviewed and updated.   Current Outpatient Medications:  .  valACYclovir (VALTREX) 1000 MG tablet, Take 1 tablet (1,000 mg total) by mouth every 12 (twelve) hours for 10 days., Disp: 20 tablet, Rfl: 0  Allergies  Allergen Reactions  . Ciprofloxacin     itching  . Sulfa Antibiotics Nausea Only    Objective:   BP (!) 145/102   Pulse 77   Temp 98.7 F (37.1 C) (Temporal)   Ht 5\' 2"  (1.575 m)   Wt 226 lb (102.5 kg)   SpO2 97%   BMI 41.34 kg/m    Physical Exam Vitals reviewed. Exam conducted with a chaperone present.  Constitutional:      General: She is not in acute distress.    Appearance: Normal appearance. She is morbidly obese. She is not ill-appearing, toxic-appearing or diaphoretic.  HENT:     Head: Normocephalic and atraumatic.  Eyes:     General: No scleral icterus.       Right eye: No  discharge.        Left eye: No discharge.     Conjunctiva/sclera: Conjunctivae normal.  Cardiovascular:     Rate and Rhythm: Normal rate.  Pulmonary:     Effort: Pulmonary effort is normal. No respiratory distress.  Genitourinary:    Pubic Area: No rash or pubic lice.      Labia:        Right: Lesion (multiple ulcerated to right labia majora) present.     Musculoskeletal:        General: Normal range of motion.     Cervical back: Normal range of motion.  Skin:    General: Skin is warm and dry.     Capillary Refill: Capillary refill takes less than 2 seconds.  Neurological:     General: No focal deficit present.     Mental Status: She is alert and oriented to person, place, and time. Mental status is at baseline.  Psychiatric:        Mood and Affect: Mood normal.        Behavior: Behavior normal.        Thought Content: Thought content normal.        Judgment: Judgment normal.

## 2019-11-03 NOTE — Patient Instructions (Signed)

## 2019-11-06 LAB — HERPES SIMPLEX VIRUS CULTURE

## 2019-11-07 ENCOUNTER — Other Ambulatory Visit: Payer: BC Managed Care – PPO

## 2019-11-07 ENCOUNTER — Encounter: Payer: Self-pay | Admitting: Family Medicine

## 2019-11-07 ENCOUNTER — Other Ambulatory Visit: Payer: Self-pay

## 2019-11-07 ENCOUNTER — Other Ambulatory Visit: Payer: Self-pay | Admitting: Family Medicine

## 2019-11-07 DIAGNOSIS — N765 Ulceration of vagina: Secondary | ICD-10-CM

## 2019-11-07 MED ORDER — DOXYCYCLINE HYCLATE 100 MG PO TABS: 100.0000 mg | ORAL_TABLET | Freq: Two times a day (BID) | ORAL | 0 refills | Status: DC

## 2019-11-08 LAB — HSV TYPE I/II IGG, IGMW/ REFLEX
HSV 1 Glycoprotein G Ab, IgG: <0.91 index (ref 0.00–0.90)
HSV 1 IgM: <1:10 {titer}
HSV 2 IgG, Type Spec: <0.91 index (ref 0.00–0.90)
HSV 2 IgM: <1:10 {titer}

## 2019-11-11 ENCOUNTER — Other Ambulatory Visit: Payer: Self-pay | Admitting: Physician Assistant

## 2019-11-11 ENCOUNTER — Other Ambulatory Visit: Payer: Self-pay

## 2019-11-11 ENCOUNTER — Encounter: Payer: Self-pay | Admitting: Family Medicine

## 2019-11-11 MED ORDER — FLUCONAZOLE 150 MG PO TABS: 150.0000 mg | ORAL_TABLET | Freq: Once | ORAL | 0 refills | Status: AC

## 2019-11-14 ENCOUNTER — Ambulatory Visit (INDEPENDENT_AMBULATORY_CARE_PROVIDER_SITE_OTHER): Payer: BC Managed Care – PPO | Admitting: Family Medicine

## 2019-11-14 ENCOUNTER — Encounter: Payer: Self-pay | Admitting: Family Medicine

## 2019-11-14 ENCOUNTER — Other Ambulatory Visit: Payer: Self-pay

## 2019-11-14 VITALS — BP 132/84 | HR 76 | Temp 99.1°F | Ht 62.0 in | Wt 225.0 lb

## 2019-11-14 DIAGNOSIS — N898 Other specified noninflammatory disorders of vagina: Secondary | ICD-10-CM | POA: Diagnosis not present

## 2019-11-14 DIAGNOSIS — G44229 Chronic tension-type headache, not intractable: Secondary | ICD-10-CM | POA: Diagnosis not present

## 2019-11-14 DIAGNOSIS — Z0001 Encounter for general adult medical examination with abnormal findings: Secondary | ICD-10-CM

## 2019-11-14 DIAGNOSIS — K146 Glossodynia: Secondary | ICD-10-CM

## 2019-11-14 DIAGNOSIS — N912 Amenorrhea, unspecified: Secondary | ICD-10-CM

## 2019-11-14 DIAGNOSIS — R002 Palpitations: Secondary | ICD-10-CM | POA: Diagnosis not present

## 2019-11-14 DIAGNOSIS — Z0000 Encounter for general adult medical examination without abnormal findings: Secondary | ICD-10-CM

## 2019-11-14 LAB — URINALYSIS
Bilirubin, UA: NEGATIVE
Glucose, UA: NEGATIVE
Ketones, UA: NEGATIVE
Leukocytes,UA: NEGATIVE
Nitrite, UA: NEGATIVE
Specific Gravity, UA: 1.025 (ref 1.005–1.030)
Urobilinogen, Ur: 0.2 mg/dL (ref 0.2–1.0)
pH, UA: 7 (ref 5.0–7.5)

## 2019-11-14 LAB — BAYER DCA HB A1C WAIVED: HB A1C (BAYER DCA - WAIVED): 5.4 % (ref <7.0)

## 2019-11-14 LAB — PREGNANCY, URINE: Preg Test, Ur: NEGATIVE

## 2019-11-14 MED ORDER — FLUCONAZOLE 150 MG PO TABS: 150.0000 mg | ORAL_TABLET | Freq: Once | ORAL | 0 refills | Status: AC

## 2019-11-14 MED ORDER — METOPROLOL TARTRATE 25 MG PO TABS: 12.5000 mg | ORAL_TABLET | Freq: Two times a day (BID) | ORAL | 3 refills | Status: DC

## 2019-11-14 NOTE — Progress Notes (Signed)
Erin Figueroa is a 29 y.o. female presents to office today for annual physical exam examination.    Concerns today include: 1.  Heart palpitations, headaches Patient reports intermittent heart palpitations.  Though she notes that this is not nearly as bad as it used to be.  She was previously on metoprolol 12.5 mg twice daily and felt that this did help control symptoms.  Additionally, she reports recurrent headache.  She feels that these are exacerbated by working.  Does not report any photophobia, phonophobia, nausea, vomiting  2.  Vaginal itching Patient reports ongoing vaginal itching that is slightly better since use of Diflucan.  Of note, she was recently treated for a vaginal infection with doxycycline.  This was thought initially to be herpes but in fact was seronegative for HSV as well as a negative culture for HSV.  Occupation: [employer] office, Marital status: married, Substance use: none Diet: fair, Exercise: no structured Last pap smear: will get with OB at [facility] needed today: Metoprolol Immunizations needed: UTD  Past Medical History:  Diagnosis Date  . Palpitation    Social History   Socioeconomic History  . Marital status: Married    Spouse name: Not on file  . Number of children: 1  . Years of education: Not on file  . Highest education level: Not on file  Occupational History  . Not on file  Tobacco Use  . Smoking status: Never Smoker  . Smokeless tobacco: Never Used  Substance and Sexual Activity  . Alcohol use: No  . Drug use: No  . Sexual activity: Yes    Birth control/protection: None  Other Topics Concern  . Not on file  Social History Narrative   Patient is married and has one 47-year-old son.   Social Determinants of Health   Financial Resource Strain:   . Difficulty of Paying Living Expenses: Not on file  Food Insecurity:   . Worried About Charity fundraiser in the Last Year: Not on file  . Ran Out of Food in the Last  Year: Not on file  Transportation Needs:   . Lack of Transportation (Medical): Not on file  . Lack of Transportation (Non-Medical): Not on file  Physical Activity:   . Days of Exercise per Week: Not on file  . Minutes of Exercise per Session: Not on file  Stress:   . Feeling of Stress : Not on file  Social Connections:   . Frequency of Communication with Friends and Family: Not on file  . Frequency of Social Gatherings with Friends and Family: Not on file  . Attends Religious Services: Not on file  . Active Member of Clubs or Organizations: Not on file  . Attends Archivist Meetings: Not on file  . Marital Status: Not on file  Intimate Partner Violence:   . Fear of Current or Ex-Partner: Not on file  . Emotionally Abused: Not on file  . Physically Abused: Not on file  . Sexually Abused: Not on file   Past Surgical History:  Procedure Laterality Date  . CESAREAN SECTION    . CHOLECYSTECTOMY    . CHOLECYSTECTOMY N/A 09/24/2014   Procedure: LAPAROSCOPIC CHOLECYSTECTOMY WITH INTRAOPERATIVE CHOLANGIOGRAM;  Surgeon: Alphonsa Overall, MD;  Location: [facility] ORS;  Service: General;  Laterality: N/A;  . ENDOSCOPIC RETROGRADE CHOLANGIOPANCREATOGRAPHY (ERCP) WITH PROPOFOL N/A 09/25/2014   Procedure: ENDOSCOPIC RETROGRADE CHOLANGIOPANCREATOGRAPHY (ERCP) WITH PROPOFOL;  Surgeon: Jeryl Columbia, MD;  Location: [facility] ENDOSCOPY;  Service: Endoscopy;  Laterality:  N/A;   Family History  Problem Relation Age of Onset  . Depression Mother   . Bipolar disorder Mother   . Hypertension Mother   . Diabetes Father   . Hypertension Father   . Hypertension Maternal Grandmother   . Cancer Maternal Grandfather   . Heart attack Paternal Grandmother 60  . Heart attack Paternal Grandfather    No current outpatient medications on file.  Allergies  Allergen Reactions  . Ciprofloxacin     itching  . Sulfa Antibiotics Nausea Only     ROS: Review of Systems Constitutional: negative Eyes: negative Ears,  nose, mouth, throat, and face: negative Respiratory: negative Cardiovascular: positive for palpitations Gastrointestinal: negative Genitourinary:positive for vaginal itching Integument/breast: negative Hematologic/lymphatic: negative Musculoskeletal:negative Neurological: positive for headaches Behavioral/Psych: negative Endocrine: negative Allergic/Immunologic: negative     Physical exam BP 132/84   Pulse 76   Temp 99.1 F (37.3 C) (Temporal)   Ht '5\' 2"'  (1.575 m)   Wt 225 lb (102.1 kg)   SpO2 99%   BMI 41.15 kg/m  General appearance: alert, cooperative, appears stated age, no distress and moderately obese Head: Normocephalic, without obvious abnormality, atraumatic Eyes: negative findings: lids and lashes normal, conjunctivae and sclerae normal and corneas clear Ears: normal TM's and external ear canals both ears Nose: Nares normal. Septum midline. Mucosa normal. No drainage or sinus tenderness. Throat: lips, mucosa, and tongue normal; teeth and gums normal; geographic tongue noted Neck: no adenopathy, supple, symmetrical, trachea midline and thyroid not enlarged, symmetric, no tenderness/mass/nodules Back: symmetric, no curvature. ROM normal. No CVA tenderness. Lungs: clear to auscultation bilaterally Heart: regular rate and rhythm, S1, S2 normal, no murmur, click, rub or gallop Abdomen: soft, non-tender; bowel sounds normal; no masses,  no organomegaly Extremities: extremities normal, atraumatic, no cyanosis or edema Pulses: 2+ and symmetric Skin: Skin color, texture, turgor normal. No rashes or lesions Lymph nodes: Cervical, supraclavicular, and axillary nodes normal. Neurologic: Alert and oriented X 3, normal strength and tone. Normal symmetric reflexes. Normal coordination and gait; no focal neurologic deficits Psych: Mood stable, speech normal, affect appropriate, pleasant and interactive  Depression screen [facility] 2/9 11/14/2019 11/03/2019 12/15/2017  Decreased Interest 0 0 0   Down, Depressed, Hopeless 0 0 0  PHQ - 2 Score 0 0 0  Altered sleeping 0 0 -  Tired, decreased energy 0 0 -  Change in appetite 0 0 -  Feeling bad or failure about yourself  0 0 -  Trouble concentrating 0 0 -  Moving slowly or fidgety/restless 0 0 -  Suicidal thoughts 0 - -  PHQ-9 Score 0 0 -   Assessment/ Plan: Allyson Sabal here for annual physical exam.   1. Annual physical exam  2. Palpitations Regular rate and rhythm on today's exam.  Paroxysmal palpitations previously controlled by metoprolol 12.5 mg twice daily.  Given her chronic tension type headaches, will also utilize this medication for that.  Check metabolic labs. - metoprolol tartrate (LOPRESSOR) 25 MG tablet; Take 0.5 tablets (12.5 mg total) by mouth 2 (two) times daily.  Dispense: 90 tablet; Refill: 3 - CMP14+EGFR - TSH - CBC - Urinalysis  3. Chronic tension-type headache, not intractable - metoprolol tartrate (LOPRESSOR) 25 MG tablet; Take 0.5 tablets (12.5 mg total) by mouth 2 (two) times daily.  Dispense: 90 tablet; Refill: 3 - Urinalysis  4. Morbid obesity (HCC) Check fasting lipid, A1c given father's history of diabetes - Lipid Panel - Bayer DCA Hb A1c Waived - Urinalysis  5. Vagina itching  I suspect secondary to recent antibiotic use. - fluconazole (DIFLUCAN) 150 MG tablet; Take 1 tablet (150 mg total) by mouth once for 1 dose.  Dispense: 1 tablet; Refill: 0  6. Amenorrhea - Pregnancy, urine  7. Burning tongue - Vitamin B12   Counseled on healthy lifestyle choices, including diet (rich in fruits, vegetables and lean meats and low in salt and simple carbohydrates) and exercise (at least 30 minutes of moderate physical activity daily).  Patient to follow up in 1 year for annual exam or sooner if needed.  Nichlos Kunzler M. Lajuana Ripple, DO

## 2019-11-14 NOTE — Patient Instructions (Signed)
Preventive Care 21-31 Years Old, Female Preventive care refers to visits with your health care provider and lifestyle choices that can promote health and wellness. This includes:  A yearly physical exam. This may also be called an annual well check.  Regular dental visits and eye exams.  Immunizations.  Screening for certain conditions.  Healthy lifestyle choices, such as eating a healthy diet, getting regular exercise, not using drugs or products that contain nicotine and tobacco, and limiting alcohol use. What can I expect for my preventive care visit? Physical exam Your health care provider will check your:  Height and weight. This may be used to calculate body mass index (BMI), which tells if you are at a healthy weight.  Heart rate and blood pressure.  Skin for abnormal spots. Counseling Your health care provider may ask you questions about your:  Alcohol, tobacco, and drug use.  Emotional well-being.  Home and relationship well-being.  Sexual activity.  Eating habits.  Work and work environment.  Method of birth control.  Menstrual cycle.  Pregnancy history. What immunizations do I need?  Influenza (flu) vaccine  This is recommended every year. Tetanus, diphtheria, and pertussis (Tdap) vaccine  You may need a Td booster every 10 years. Varicella (chickenpox) vaccine  You may need this if you have not been vaccinated. Human papillomavirus (HPV) vaccine  If recommended by your health care provider, you may need three doses over 6 months. Measles, mumps, and rubella (MMR) vaccine  You may need at least one dose of MMR. You may also need a second dose. Meningococcal conjugate (MenACWY) vaccine  One dose is recommended if you are age 19-21 years and a first-year college student living in a residence hall, or if you have one of several medical conditions. You may also need additional booster doses. Pneumococcal conjugate (PCV13) vaccine  You may need  this if you have certain conditions and were not previously vaccinated. Pneumococcal polysaccharide (PPSV23) vaccine  You may need one or two doses if you smoke cigarettes or if you have certain conditions. Hepatitis A vaccine  You may need this if you have certain conditions or if you travel or work in places where you may be exposed to hepatitis A. Hepatitis B vaccine  You may need this if you have certain conditions or if you travel or work in places where you may be exposed to hepatitis B. Haemophilus influenzae type b (Hib) vaccine  You may need this if you have certain conditions. You may receive vaccines as individual doses or as more than one vaccine together in one shot (combination vaccines). Talk with your health care provider about the risks and benefits of combination vaccines. What tests do I need?  Blood tests  Lipid and cholesterol levels. These may be checked every 5 years starting at age 20.  Hepatitis C test.  Hepatitis B test. Screening  Diabetes screening. This is done by checking your blood sugar (glucose) after you have not eaten for a while (fasting).  Sexually transmitted disease (STD) testing.  BRCA-related cancer screening. This may be done if you have a family history of breast, ovarian, tubal, or peritoneal cancers.  Pelvic exam and Pap test. This may be done every 3 years starting at age 21. Starting at age 30, this may be done every 5 years if you have a Pap test in combination with an HPV test. Talk with your health care provider about your test results, treatment options, and if necessary, the need for more tests.   Follow these instructions at home: Eating and drinking   Eat a diet that includes fresh fruits and vegetables, whole grains, lean protein, and low-fat dairy.  Take vitamin and mineral supplements as recommended by your health care provider.  Do not drink alcohol if: ? Your health care provider tells you not to drink. ? You are  pregnant, may be pregnant, or are planning to become pregnant.  If you drink alcohol: ? Limit how much you have to 0-1 drink a day. ? Be aware of how much alcohol is in your drink. In the [place] one drink equals one 12 oz bottle of beer (355 mL), one 5 oz glass of wine (148 mL), or one 1 oz glass of hard liquor (44 mL). Lifestyle  Take daily care of your teeth and gums.  Stay active. Exercise for at least 30 minutes on 5 or more days each week.  Do not use any products that contain nicotine or tobacco, such as cigarettes, e-cigarettes, and chewing tobacco. If you need help quitting, ask your health care provider.  If you are sexually active, practice safe sex. Use a condom or other form of birth control (contraception) in order to prevent pregnancy and STIs (sexually transmitted infections). If you plan to become pregnant, see your health care provider for a preconception visit. What's next?  Visit your health care provider once a year for a well check visit.  Ask your health care provider how often you should have your eyes and teeth checked.  Stay up to date on all vaccines. This information is not intended to replace advice given to you by your health care provider. Make sure you discuss any questions you have with your health care provider. Document Revised: 05/06/2018 Document Reviewed: 05/06/2018 Elsevier Patient Education  2020 [employer].

## 2019-11-15 ENCOUNTER — Encounter: Payer: Self-pay | Admitting: Family Medicine

## 2019-11-15 ENCOUNTER — Other Ambulatory Visit: Payer: Self-pay | Admitting: Family Medicine

## 2019-11-15 DIAGNOSIS — R8290 Unspecified abnormal findings in urine: Secondary | ICD-10-CM

## 2019-11-15 LAB — CMP14+EGFR
ALT: 22 IU/L (ref 0–32)
AST: 22 IU/L (ref 0–40)
Albumin/Globulin Ratio: 1.8 (ref 1.2–2.2)
Albumin: 4.7 g/dL (ref 3.9–5.0)
Alkaline Phosphatase: 101 IU/L (ref 39–117)
BUN/Creatinine Ratio: 14 (ref 9–23)
BUN: 11 mg/dL (ref 6–20)
Bilirubin Total: 0.2 mg/dL (ref 0.0–1.2)
CO2: 24 mmol/L (ref 20–29)
Calcium: 9.2 mg/dL (ref 8.7–10.2)
Chloride: 102 mmol/L (ref 96–106)
Creatinine, Ser: 0.8 mg/dL (ref 0.57–1.00)
GFR calc Af Amer: 114 mL/min/{1.73_m2} (ref >59)
GFR calc non Af Amer: 99 mL/min/{1.73_m2} (ref >59)
Globulin, Total: 2.6 g/dL (ref 1.5–4.5)
Glucose: 80 mg/dL (ref 65–99)
Potassium: 4.5 mmol/L (ref 3.5–5.2)
Sodium: 138 mmol/L (ref 134–144)
Total Protein: 7.3 g/dL (ref 6.0–8.5)

## 2019-11-15 LAB — LIPID PANEL
Chol/HDL Ratio: 4.3 ratio (ref 0.0–4.4)
Cholesterol, Total: 159 mg/dL (ref 100–199)
HDL: 37 mg/dL — ABNORMAL LOW (ref >39)
LDL Chol Calc (NIH): 94 mg/dL (ref 0–99)
Triglycerides: 159 mg/dL — ABNORMAL HIGH (ref 0–149)
VLDL Cholesterol Cal: 28 mg/dL (ref 5–40)

## 2019-11-15 LAB — CBC
Hematocrit: 43.1 % (ref 34.0–46.6)
Hemoglobin: 14.4 g/dL (ref 11.1–15.9)
MCH: 28.2 pg (ref 26.6–33.0)
MCHC: 33.4 g/dL (ref 31.5–35.7)
MCV: 84 fL (ref 79–97)
Platelets: 300 10*3/uL (ref 150–450)
RBC: 5.11 x10E6/uL (ref 3.77–5.28)
RDW: 13.3 % (ref 11.7–15.4)
WBC: 7.6 10*3/uL (ref 3.4–10.8)

## 2019-11-15 LAB — TSH: TSH: 0.911 u[IU]/mL (ref 0.450–4.500)

## 2020-02-01 ENCOUNTER — Encounter: Payer: Self-pay | Admitting: Family Medicine

## 2020-02-02 ENCOUNTER — Other Ambulatory Visit: Payer: Self-pay | Admitting: Family Medicine

## 2020-02-02 MED ORDER — FLUCONAZOLE 150 MG PO TABS: 150.0000 mg | ORAL_TABLET | Freq: Once | ORAL | 0 refills | Status: AC

## 2020-05-28 ENCOUNTER — Encounter: Payer: Self-pay | Admitting: Family Medicine

## 2021-05-14 ENCOUNTER — Ambulatory Visit: Payer: 59 | Admitting: Family Medicine

## 2021-05-14 ENCOUNTER — Encounter: Payer: Self-pay | Admitting: Family Medicine

## 2021-05-14 VITALS — BP 134/87 | HR 83 | Temp 97.8°F | Ht 62.0 in | Wt 229.4 lb

## 2021-05-14 DIAGNOSIS — F411 Generalized anxiety disorder: Secondary | ICD-10-CM

## 2021-05-14 DIAGNOSIS — R002 Palpitations: Secondary | ICD-10-CM | POA: Diagnosis not present

## 2021-05-14 DIAGNOSIS — F410 Panic disorder [episodic paroxysmal anxiety] without agoraphobia: Secondary | ICD-10-CM | POA: Diagnosis not present

## 2021-05-14 DIAGNOSIS — R0789 Other chest pain: Secondary | ICD-10-CM | POA: Diagnosis not present

## 2021-05-14 MED ORDER — SERTRALINE HCL 50 MG PO TABS: 50.0000 mg | ORAL_TABLET | Freq: Every day | ORAL | 5 refills | Status: DC

## 2021-05-14 MED ORDER — LORAZEPAM 0.5 MG PO TABS: 0.2500 mg | ORAL_TABLET | Freq: Two times a day (BID) | ORAL | 0 refills | Status: AC | PRN

## 2021-05-14 NOTE — Progress Notes (Signed)
Subjective: GG:YIRSW pain PCP: Janora Norlander, DO NIO:EVOJJ Erin Figueroa is a 30 y.o. female presenting to clinic today for:  1. Chest pain Reports that she has been having some substernal sharp chest pains that are intermittent for the last day.  Denies any associated shortness of breath, nausea, vomiting, diaphoresis.  No hemoptysis.  No recent illness.  She reports associated palpitations.  She admits to having lapsed in her metoprolol.  She typically takes 1 tablet daily.  Prior to arrival she took 4 aspirins and her metoprolol because this is what her family member, who is a retired EMT told her to do.  She has been going through a lot of stress as of late.  Her father-in-law, who was like a father to her, passed away abruptly from an MI and this weighs heavily on her.  She admits to sleepless nights, increased anxiety.  Prior to the event she had already had issues with anxiety disorder but was not treated.  She has been sleeping poorly, she worries a lot.   ROS: Per HPI  Allergies  Allergen Reactions   Ciprofloxacin     itching   Sulfa Antibiotics Nausea Only   Past Medical History:  Diagnosis Date   Palpitation     Current Outpatient Medications:    metoprolol tartrate (LOPRESSOR) 25 MG tablet, Take 0.5 tablets (12.5 mg total) by mouth 2 (two) times daily., Disp: 90 tablet, Rfl: 3 Social History   Socioeconomic History   Marital status: Married    Spouse name: Not on file   Number of children: 1   Years of education: Not on file   Highest education level: Not on file  Occupational History   Not on file  Tobacco Use   Smoking status: Never   Smokeless tobacco: Never  Vaping Use   Vaping Use: Never used  Substance and Sexual Activity   Alcohol use: No   Drug use: No   Sexual activity: Yes    Birth control/protection: None  Other Topics Concern   Not on file  Social History Narrative   Patient is married and has one 39-year-old son.   Social Determinants of  Health   Financial Resource Strain: Not on file  Food Insecurity: Not on file  Transportation Needs: Not on file  Physical Activity: Not on file  Stress: Not on file  Social Connections: Not on file  Intimate Partner Violence: Not on file   Family History  Problem Relation Age of Onset   Depression Mother    Bipolar disorder Mother    Hypertension Mother    Diabetes Father    Hypertension Father    Diabetes Mellitus II Father    Hyperlipidemia Father    Hypertension Maternal Grandmother    Stroke Maternal Grandmother    Heart attack Maternal Grandmother    Cancer Maternal Grandfather    Heart attack Paternal Grandmother 63   Heart attack Paternal Grandfather     Objective: Office vital signs reviewed. BP 134/87   Pulse 83   Temp 97.8 F (36.6 C)   Ht '5\' 2"'  (1.575 m)   Wt 229 lb 6.4 oz (104.1 kg)   SpO2 99%   BMI 41.96 kg/m   Physical Examination:  General: Awake, alert, obese, tearful HEENT: Normal; no thyromegaly, goiter, exophthalmos Cardio: regular rate and rhythm, S1S2 heard, no murmurs appreciated Pulm: clear to auscultation bilaterally, no wheezes, rhonchi or rales; normal work of breathing on room air Extremities: warm, well perfused, No edema, cyanosis  or clubbing; +2 pulses bilaterally Psych: Tearful.  Thought process linear.  Good eye contact.  Depression screen [facility] 2/9 11/14/2019 11/03/2019 12/15/2017  Decreased Interest 0 0 0  Down, Depressed, Hopeless 0 0 0  PHQ - 2 Score 0 0 0  Altered sleeping 0 0 -  Tired, decreased energy 0 0 -  Change in appetite 0 0 -  Feeling bad or failure about yourself  0 0 -  Trouble concentrating 0 0 -  Moving slowly or fidgety/restless 0 0 -  Suicidal thoughts 0 - -  PHQ-9 Score 0 0 -   GAD 7 : Generalized Anxiety Score 12/15/2017  Nervous, Anxious, on Edge 0  Control/stop worrying 1  Worry too much - different things 2  Trouble relaxing 0  Restless 0  Easily annoyed or irritable 0  Afraid - awful might happen 0   Total GAD 7 Score 3    Assessment/ Plan: 30 y.o. female   Atypical chest pain - Plan: EKG 12-Lead, Troponin T  Palpitations - Plan: TSH, T4, Free, CMP14+EGFR, CBC, Magnesium  Generalized anxiety disorder with panic attacks - Plan: LORazepam (ATIVAN) 0.5 MG tablet, sertraline (ZOLOFT) 50 MG tablet  I do not see any significant changes from her EKG obtained several years ago.  I am ordering a troponin just for completion but think that the likelihood of her having ACS at her age, despite obesity, would be extremely unlikely.  Her chest pain was atypical and not associated with any red flag symptoms.  I think this is more likely a manifestation of uncontrolled anxiety and panic, particularly exacerbated by recent events.  I placed her on Zoloft, Ativan.  We discussed that abrupt discontinuation of her beta-blocker can sometimes precipitate anginal symptoms and I have advised against abrupt discontinuation.  We will check for other metabolic abnormalities as well given heart palpitations.  Would like to see her back in about 2 weeks and a video visit has been scheduled.  The Narcotic Database has been reviewed.  There were no red flags.     Orders Placed This Encounter  Procedures   EKG 12-Lead   No orders of the defined types were placed in this encounter.    Janora Norlander, DO [place] [phone]

## 2021-05-17 LAB — CMP14+EGFR
ALT: 23 IU/L (ref 0–32)
AST: 19 IU/L (ref 0–40)
Albumin/Globulin Ratio: 1.9 (ref 1.2–2.2)
Albumin: 4.3 g/dL (ref 3.8–4.8)
Alkaline Phosphatase: 84 IU/L (ref 44–121)
BUN/Creatinine Ratio: 13 (ref 9–23)
BUN: 10 mg/dL (ref 6–20)
Bilirubin Total: 0.2 mg/dL (ref 0.0–1.2)
CO2: 22 mmol/L (ref 20–29)
Calcium: 9.2 mg/dL (ref 8.7–10.2)
Chloride: 103 mmol/L (ref 96–106)
Creatinine, Ser: 0.76 mg/dL (ref 0.57–1.00)
Globulin, Total: 2.3 g/dL (ref 1.5–4.5)
Glucose: 121 mg/dL — ABNORMAL HIGH (ref 65–99)
Potassium: 4 mmol/L (ref 3.5–5.2)
Sodium: 138 mmol/L (ref 134–144)
Total Protein: 6.6 g/dL (ref 6.0–8.5)
eGFR: 107 mL/min/{1.73_m2} (ref >59)

## 2021-05-17 LAB — CBC
Hematocrit: 39.4 % (ref 34.0–46.6)
Hemoglobin: 13.3 g/dL (ref 11.1–15.9)
MCH: 28.9 pg (ref 26.6–33.0)
MCHC: 33.8 g/dL (ref 31.5–35.7)
MCV: 86 fL (ref 79–97)
Platelets: 311 10*3/uL (ref 150–450)
RBC: 4.61 x10E6/uL (ref 3.77–5.28)
RDW: 12.9 % (ref 11.7–15.4)
WBC: 10 10*3/uL (ref 3.4–10.8)

## 2021-05-17 LAB — MAGNESIUM: Magnesium: 1.9 mg/dL (ref 1.6–2.3)

## 2021-05-17 LAB — T4, FREE: Free T4: 1.42 ng/dL (ref 0.82–1.77)

## 2021-05-17 LAB — TROPONIN T: Troponin T (Highly Sensitive): <6 ng/L (ref 0–14)

## 2021-05-17 LAB — TSH: TSH: 0.8 u[IU]/mL (ref 0.450–4.500)

## 2021-05-28 ENCOUNTER — Telehealth (INDEPENDENT_AMBULATORY_CARE_PROVIDER_SITE_OTHER): Payer: 59 | Admitting: Family Medicine

## 2021-05-28 DIAGNOSIS — F410 Panic disorder [episodic paroxysmal anxiety] without agoraphobia: Secondary | ICD-10-CM | POA: Diagnosis not present

## 2021-05-28 DIAGNOSIS — F411 Generalized anxiety disorder: Secondary | ICD-10-CM | POA: Diagnosis not present

## 2021-05-28 DIAGNOSIS — R739 Hyperglycemia, unspecified: Secondary | ICD-10-CM | POA: Diagnosis not present

## 2021-05-28 NOTE — Progress Notes (Signed)
MyChart Video visit  Subjective: CC: panic attack PCP: Raliegh Ip, DO ZOX:WRUEA Erin Figueroa is a 30 y.o. female. Patient provides verbal consent for consult held via video.  Due to COVID-19 pandemic this visit was conducted virtually. This visit type was conducted due to national recommendations for restrictions regarding the COVID-19 Pandemic (e.g. social distancing, sheltering in place) in an effort to limit this patient's exposure and mitigate transmission in our community. All issues noted in this document were discussed and addressed.  A physical exam was not performed with this format.   Location of patient: work Location of provider: [facility] Others present for call: none  1. GAD/ Panic attacks She reports quite a bit of improvement in symptoms with Zoloft 50 mg daily.  She only used the Ativan at 0.25 mg a couple of days but she was having dry mouth and felt like her throat was extremely irritated after you so she discontinued.  She denies any GI symptoms with the Zoloft.  She feels improvement in panic and anxiety symptoms in general.  No recurrence of chest pain or heart palpitations.  She is been compliant with her metoprolol.  She is pleased with current dose.   ROS: Per HPI  Allergies  Allergen Reactions   Ciprofloxacin     itching   Sulfa Antibiotics Nausea Only   Past Medical History:  Diagnosis Date   Palpitation     Current Outpatient Medications:    LORazepam (ATIVAN) 0.5 MG tablet, Take 0.5-1 tablets (0.25-0.5 mg total) by mouth 2 (two) times daily as needed for anxiety., Disp: 30 tablet, Rfl: 0   metoprolol tartrate (LOPRESSOR) 25 MG tablet, Take 0.5 tablets (12.5 mg total) by mouth 2 (two) times daily., Disp: 90 tablet, Rfl: 3   sertraline (ZOLOFT) 50 MG tablet, Take 1 tablet (50 mg total) by mouth daily., Disp: 30 tablet, Rfl: 5  Gen: Well-appearing female.  No acute distress. Psych: Good eye contact.  Thought process linear.  Appears to be much happier  and brighter than the last time we talked.   Assessment/ Plan: 30 y.o. female   Generalized anxiety disorder with panic attacks - Plan: TSH, T4, free  Morbid obesity (HCC) - Plan: Lipid panel, Bayer DCA Hb A1c Waived, TSH, T4, free  Elevated serum glucose - Plan: Bayer DCA Hb A1c Waived  Doing much better than her previous visit.  Seems to be doing good on the 50 mg of Zoloft so we will continue that at current dose.  We have made an appointment for annual physical with fasting labs in November.  She is aware of date and time.  She will contact me prior to that time if any concerns or questions arise.  Start time: 12:47pm End time: 12:54pm  Total time spent on patient care (including video visit/ documentation): 7 minutes  Oluwatoyin Banales Hulen Skains, DO [facility] [phone]

## 2021-06-10 ENCOUNTER — Encounter: Payer: Self-pay | Admitting: Family Medicine

## 2021-06-10 DIAGNOSIS — R002 Palpitations: Secondary | ICD-10-CM

## 2021-06-10 DIAGNOSIS — G44229 Chronic tension-type headache, not intractable: Secondary | ICD-10-CM

## 2021-06-10 MED ORDER — METOPROLOL TARTRATE 25 MG PO TABS: 12.5000 mg | ORAL_TABLET | Freq: Two times a day (BID) | ORAL | 3 refills | Status: DC

## 2021-07-29 ENCOUNTER — Encounter: Payer: 59 | Admitting: Family Medicine

## 2021-10-01 ENCOUNTER — Encounter: Payer: Self-pay | Admitting: Family Medicine

## 2021-10-01 DIAGNOSIS — F411 Generalized anxiety disorder: Secondary | ICD-10-CM

## 2021-10-01 DIAGNOSIS — G44229 Chronic tension-type headache, not intractable: Secondary | ICD-10-CM

## 2021-10-01 DIAGNOSIS — R002 Palpitations: Secondary | ICD-10-CM

## 2021-10-02 MED ORDER — METOPROLOL TARTRATE 25 MG PO TABS: 12.5000 mg | ORAL_TABLET | Freq: Two times a day (BID) | ORAL | 0 refills | Status: AC

## 2021-10-02 MED ORDER — SERTRALINE HCL 50 MG PO TABS: 50.0000 mg | ORAL_TABLET | Freq: Every day | ORAL | 0 refills | Status: AC

## 2023-12-15 ENCOUNTER — Telehealth: Payer: Self-pay | Admitting: Podiatry

## 2023-12-15 NOTE — Telephone Encounter (Signed)
 Sent Pt a message through MyChart and LVM. We to complete her demographics including getting active insurance prior to her appointment.

## 2023-12-18 ENCOUNTER — Ambulatory Visit: Admitting: Podiatry

## 2024-03-15 ENCOUNTER — Ambulatory Visit: Payer: Self-pay | Admitting: Allergy and Immunology

## 2024-03-31 ENCOUNTER — Ambulatory Visit: Admitting: Allergy

## 2024-04-21 ENCOUNTER — Ambulatory Visit: Admitting: Allergy

## 2024-04-21 NOTE — Progress Notes (Deleted)
 New Patient Note  RE: Erin Figueroa MRN: [id number] DOB: 10-Mar-1991 Date of Office Visit: 04/21/2024  Consult requested by: Job Bolt, [place] Primary care provider: Jolinda Norene HERO, DO  Chief Complaint: No chief complaint on file.  History of Present Illness: I had the pleasure of seeing Eudell Julian for initial evaluation at the [facility] on 04/21/2024. She is a 33 y.o. female, who is referred here by Job Bolt, PA for the evaluation of ***.  Discussed the use of AI scribe software for clinical note transcription with the patient, who gave verbal consent to proceed.  History of Present Illness             01/18/2024 UC visit: Patient states she accidentally touched her tongue to the glue for her fingernails 2 days ago, she was in a meeting today and her tongue started feeling tingly at the tip and she thinks her upper and lower lip on the right side are swelling. She adds in that she has a geographic tongue and sometimes it bothers her. Vital signs are stable, physical exam she is in no acute distress there is no swelling of her pharynx, CTAB, RRR, positive bowel sounds. No skin rash or swelling noted. Patient states she feels the tip of her tongue is tingling. Patient given 50 mg of Benadryl liquid and Pepcid 20 mg p.o. At discharge she is stable she still feels a slight tingling at the very tip of her tongue. Avoid touching glue or other chemicals in the mouth. Follow-up with your doctor for allergy testing. Do dilute salt water gargles and soft foods for the next few days. Patient verbalized understanding has no questions at discharge.   Assessment and Plan: [name] is a 33 y.o. female with: ***  Assessment and Plan               No follow-ups on file.  No orders of the defined types were placed in this encounter.  Lab Orders  No laboratory test(s) ordered today    Other allergy screening: Asthma: {Blank  single:19197::yes,no} Rhino conjunctivitis: {Blank single:19197::yes,no} Food allergy: {Blank single:19197::yes,no} Medication allergy: {Blank single:19197::yes,no} Hymenoptera allergy: {Blank single:19197::yes,no} Urticaria: {Blank single:19197::yes,no} Eczema:{Blank single:19197::yes,no} History of recurrent infections suggestive of immunodeficency: {Blank single:19197::yes,no}  Diagnostics: Spirometry:  Tracings reviewed. Her effort: {Blank single:19197::Good reproducible efforts.,It was hard to get consistent efforts and there is a question as to whether this reflects a maximal maneuver.,Poor effort, data can not be interpreted.} FVC: ***L FEV1: ***L, ***% predicted FEV1/FVC ratio: ***% Interpretation: {Blank single:19197::Spirometry consistent with mild obstructive disease,Spirometry consistent with moderate obstructive disease,Spirometry consistent with severe obstructive disease,Spirometry consistent with possible restrictive disease,Spirometry consistent with mixed obstructive and restrictive disease,Spirometry uninterpretable due to technique,Spirometry consistent with normal pattern,No overt abnormalities noted given today's efforts}.  Please see scanned spirometry results for details.  Skin Testing: {Blank single:19197::Select foods,Environmental allergy panel,Environmental allergy panel and select foods,Food allergy panel,None,Deferred due to recent antihistamines use}. *** Results discussed with patient/family.   Past Medical History: Patient Active Problem List   Diagnosis Date Noted  . Amenorrhea 06/09/2017  . 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 Palpitation    Past Surgical History: Past Surgical History:  Procedure Laterality Date  . CESAREAN SECTION    . CHOLECYSTECTOMY    . CHOLECYSTECTOMY N/A 09/24/2014   Procedure: LAPAROSCOPIC CHOLECYSTECTOMY WITH  INTRAOPERATIVE CHOLANGIOGRAM;  Surgeon: Alm Angle, MD;  Location: [facility] ORS;  Service: General;  Laterality: N/A;  .  ENDOSCOPIC RETROGRADE CHOLANGIOPANCREATOGRAPHY (ERCP) WITH PROPOFOL  N/A 09/25/2014   Procedure: ENDOSCOPIC RETROGRADE CHOLANGIOPANCREATOGRAPHY (ERCP) WITH PROPOFOL ;  Surgeon: Oliva FORBES Boots, MD;  Location: [facility] ENDOSCOPY;  Service: Endoscopy;  Laterality: N/A;   Medication List:  Current Outpatient Medications  Medication Sig Dispense Refill  . LORazepam  (ATIVAN ) 0.5 MG tablet Take 0.5-1 tablets (0.25-0.5 mg total) by mouth 2 (two) times daily as needed for anxiety. 30 tablet 0  . metoprolol  tartrate (LOPRESSOR ) 25 MG tablet Take 0.5 tablets (12.5 mg total) by mouth 2 (two) times daily. (NEEDS TO BE SEEN BEFORE NEXT REFILL) 90 tablet 0  . sertraline  (ZOLOFT ) 50 MG tablet Take 1 tablet (50 mg total) by mouth daily. (NEEDS TO BE SEEN BEFORE NEXT REFILL) 90 tablet 0   No current facility-administered medications for this visit.   Allergies: Allergies  Allergen Reactions  . Ciprofloxacin      itching  . Sulfa Antibiotics Nausea Only   Social History: Social History   Socioeconomic History  . Marital status: Married    Spouse name: Not on file  . Number of children: 1  . Years of education: Not on file  . Highest education level: Not on file  Occupational History  . Not on file  Tobacco Use  . Smoking status: Never  . Smokeless tobacco: Never  Vaping Use  . Vaping status: Never Used  Substance and Sexual Activity  . Alcohol use: No  . Drug use: No  . Sexual activity: Yes    Birth control/protection: None  Other Topics Concern  . Not on file  Social History Narrative   Patient is married and has one 61-year-old son.   Social Drivers of Corporate investment banker Strain: Not on file  Food Insecurity: Not on file  Transportation Needs: Not on file  Physical Activity: Not on file  Stress: Not on file  Social Connections: Not on file   Lives in a ***. Smoking:  *** Occupation: ***  Environmental HistorySurveyor, minerals in the house: Network engineer in the family room: {Blank single:19197::yes,no} Carpet in the bedroom: {Blank single:19197::yes,no} Heating: {Blank single:19197::electric,gas,heat pump} Cooling: {Blank single:19197::central,window,heat pump} Pet: {Blank single:19197::yes ***,no}  Family History: Family History  Problem Relation Age of Onset  . Depression Mother   . Bipolar disorder Mother   . Hypertension Mother   . Diabetes Father   . Hypertension Father   . Diabetes Mellitus II Father   . Hyperlipidemia Father   . Hypertension Maternal Grandmother   . Stroke Maternal Grandmother   . Heart attack Maternal Grandmother   . Cancer Maternal Grandfather   . Heart attack Paternal Grandmother 6  . Heart attack Paternal Grandfather    Problem                               Relation Asthma                                   *** Eczema                                *** Food allergy                          *** Allergic rhino conjunctivitis     ***  Review  of Systems  Constitutional:  Negative for appetite change, chills, fever and unexpected weight change.  HENT:  Negative for congestion and rhinorrhea.   Eyes:  Negative for itching.  Respiratory:  Negative for cough, chest tightness, shortness of breath and wheezing.   Cardiovascular:  Negative for chest pain.  Gastrointestinal:  Negative for abdominal pain.  Genitourinary:  Negative for difficulty urinating.  Skin:  Negative for rash.  Neurological:  Negative for headaches.    Objective: There were no vitals taken for this visit. There is no height or weight on file to calculate BMI. Physical Exam Vitals and nursing note reviewed.  Constitutional:      Appearance: Normal appearance. She is well-developed.  HENT:     Head: Normocephalic and atraumatic.     Right Ear: Tympanic membrane and external ear normal.      Left Ear: Tympanic membrane and external ear normal.     Nose: Nose normal.     Mouth/Throat:     Mouth: Mucous membranes are moist.     Pharynx: Oropharynx is clear.  Eyes:     Conjunctiva/sclera: Conjunctivae normal.  Cardiovascular:     Rate and Rhythm: Normal rate and regular rhythm.     Heart sounds: Normal heart sounds. No murmur heard.    No friction rub. No gallop.  Pulmonary:     Effort: Pulmonary effort is normal.     Breath sounds: Normal breath sounds. No wheezing, rhonchi or rales.  Musculoskeletal:     Cervical back: Neck supple.  Skin:    General: Skin is warm.     Findings: No rash.  Neurological:     Mental Status: She is alert and oriented to person, place, and time.  Psychiatric:        Behavior: Behavior normal.   The plan was reviewed with the patient/family, and all questions/concerned were addressed.  It was my pleasure to see Erin Figueroa today and participate in her care. Please feel free to contact me with any questions or concerns.  Sincerely,  Orlan Cramp, DO Allergy & Immunology  [facility] office: [phone] [facility] office: [phone]

## 2024-05-26 ENCOUNTER — Ambulatory Visit: Admitting: Allergy

## 2024-05-26 NOTE — Progress Notes (Deleted)
 New Patient Note  RE: Erin Figueroa MRN: [id number] DOB: 11-17-1990 Date of Office Visit: 05/26/2024  Consult requested by: Jolinda Norene HERO, DO Primary care provider: Jolinda Norene HERO, DO  Chief Complaint: No chief complaint on file.  History of Present Illness: I had the pleasure of seeing Erin Figueroa for initial evaluation at the [facility] on 05/26/2024. She is a 33 y.o. female, who is referred here by Jolinda Norene HERO, DO for the evaluation of ***.  Discussed the use of AI scribe software for clinical note transcription with the patient, who gave verbal consent to proceed.  History of Present Illness             ***  Assessment and Plan: Erin Figueroa is a 33 y.o. female with: ***  Assessment and Plan               No follow-ups on file.  No orders of the defined types were placed in this encounter.  Lab Orders  No laboratory test(s) ordered today    Other allergy screening: Asthma: {Blank single:19197::yes,no} Rhino conjunctivitis: {Blank single:19197::yes,no} Food allergy: {Blank single:19197::yes,no} Medication allergy: {Blank single:19197::yes,no} Hymenoptera allergy: {Blank single:19197::yes,no} Urticaria: {Blank single:19197::yes,no} Eczema:{Blank single:19197::yes,no} History of recurrent infections suggestive of immunodeficency: {Blank single:19197::yes,no}  Diagnostics: Spirometry:  Tracings reviewed. Her effort: {Blank single:19197::Good reproducible efforts.,It was hard to get consistent efforts and there is a question as to whether this reflects a maximal maneuver.,Poor effort, data can not be interpreted.} FVC: ***L FEV1: ***L, ***% predicted FEV1/FVC ratio: ***% Interpretation: {Blank single:19197::Spirometry consistent with mild obstructive disease,Spirometry consistent with moderate obstructive disease,Spirometry consistent with severe obstructive  disease,Spirometry consistent with possible restrictive disease,Spirometry consistent with mixed obstructive and restrictive disease,Spirometry uninterpretable due to technique,Spirometry consistent with normal pattern,No overt abnormalities noted given today's efforts}.  Please see scanned spirometry results for details.  Skin Testing: {Blank single:19197::Select foods,Environmental allergy panel,Environmental allergy panel and select foods,Food allergy panel,None,Deferred due to recent antihistamines use}. *** Results discussed with patient/family.   Past Medical History: Patient Active Problem List   Diagnosis Date Noted  . Amenorrhea 06/09/2017  . 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 Palpitation    Past Surgical History: Past Surgical History:  Procedure Laterality Date  . CESAREAN SECTION    . CHOLECYSTECTOMY    . CHOLECYSTECTOMY N/A 09/24/2014   Procedure: LAPAROSCOPIC CHOLECYSTECTOMY WITH INTRAOPERATIVE CHOLANGIOGRAM;  Surgeon: Alm Angle, MD;  Location: [facility];  Service: General;  Laterality: N/A;  . ENDOSCOPIC RETROGRADE CHOLANGIOPANCREATOGRAPHY (ERCP) WITH PROPOFOL  N/A 09/25/2014   Procedure: ENDOSCOPIC RETROGRADE CHOLANGIOPANCREATOGRAPHY (ERCP) WITH PROPOFOL ;  Surgeon: Oliva FORBES Boots, MD;  Location: [facility] ENDOSCOPY;  Service: Endoscopy;  Laterality: N/A;   Medication List:  Current Outpatient Medications  Medication Sig Dispense Refill  . LORazepam  (ATIVAN ) 0.5 MG tablet Take 0.5-1 tablets (0.25-0.5 mg total) by mouth 2 (two) times daily as needed for anxiety. 30 tablet 0  . metoprolol  tartrate (LOPRESSOR ) 25 MG tablet Take 0.5 tablets (12.5 mg total) by mouth 2 (two) times daily. (NEEDS TO BE SEEN BEFORE NEXT REFILL) 90 tablet 0  . sertraline  (ZOLOFT ) 50 MG tablet Take 1 tablet (50 mg total) by mouth daily. (NEEDS TO BE SEEN BEFORE NEXT REFILL) 90 tablet 0   No current facility-administered medications for this  visit.   Allergies: Allergies  Allergen Reactions  . Ciprofloxacin      itching  . Sulfa Antibiotics Nausea Only   Social History: Social History   Socioeconomic  History  . Marital status: Married    Spouse name: Not on file  . Number of children: 1  . Years of education: Not on file  . Highest education level: Not on file  Occupational History  . Not on file  Tobacco Use  . Smoking status: Never  . Smokeless tobacco: Never  Vaping Use  . Vaping status: Never Used  Substance and Sexual Activity  . Alcohol use: No  . Drug use: No  . Sexual activity: Yes    Birth control/protection: None  Other Topics Concern  . Not on file  Social History Narrative   Patient is married and has one 70-year-old son.   Social Drivers of Corporate investment banker Strain: Not on file  Food Insecurity: Not on file  Transportation Needs: Not on file  Physical Activity: Not on file  Stress: Not on file  Social Connections: Not on file   Lives in a ***. Smoking: *** Occupation: ***  Environmental HistorySurveyor, minerals in the house: Network engineer in the family room: {Blank single:19197::yes,no} Carpet in the bedroom: {Blank single:19197::yes,no} Heating: {Blank single:19197::electric,gas,heat pump} Cooling: {Blank single:19197::central,window,heat pump} Pet: {Blank single:19197::yes ***,no}  Family History: Family History  Problem Relation Age of Onset  . Depression Mother   . Bipolar disorder Mother   . Hypertension Mother   . Diabetes Father   . Hypertension Father   . Diabetes Mellitus II Father   . Hyperlipidemia Father   . Hypertension Maternal Grandmother   . Stroke Maternal Grandmother   . Heart attack Maternal Grandmother   . Cancer Maternal Grandfather   . Heart attack Paternal Grandmother 53  . Heart attack Paternal Grandfather    Problem                               Relation Asthma                                    *** Eczema                                *** Food allergy                          *** Allergic rhino conjunctivitis     ***  Review of Systems  Constitutional:  Negative for appetite change, chills, fever and unexpected weight change.  HENT:  Negative for congestion and rhinorrhea.   Eyes:  Negative for itching.  Respiratory:  Negative for cough, chest tightness, shortness of breath and wheezing.   Cardiovascular:  Negative for chest pain.  Gastrointestinal:  Negative for abdominal pain.  Genitourinary:  Negative for difficulty urinating.  Skin:  Negative for rash.  Neurological:  Negative for headaches.    Objective: There were no vitals taken for this visit. There is no height or weight on file to calculate BMI. Physical Exam Vitals and nursing note reviewed.  Constitutional:      Appearance: Normal appearance. She is well-developed.  HENT:     Head: Normocephalic and atraumatic.     Right Ear: Tympanic membrane and external ear normal.     Left Ear: Tympanic membrane and external ear normal.     Nose: Nose normal.     Mouth/Throat:  Mouth: Mucous membranes are moist.     Pharynx: Oropharynx is clear.  Eyes:     Conjunctiva/sclera: Conjunctivae normal.  Cardiovascular:     Rate and Rhythm: Normal rate and regular rhythm.     Heart sounds: Normal heart sounds. No murmur heard.    No friction rub. No gallop.  Pulmonary:     Effort: Pulmonary effort is normal.     Breath sounds: Normal breath sounds. No wheezing, rhonchi or rales.  Musculoskeletal:     Cervical back: Neck supple.  Skin:    General: Skin is warm.     Findings: No rash.  Neurological:     Mental Status: She is alert and oriented to person, place, and time.  Psychiatric:        Behavior: Behavior normal.   The plan was reviewed with the patient/family, and all questions/concerned were addressed.  It was my pleasure to see Erin Figueroa today and participate in her care. Please feel free to  contact me with any questions or concerns.  Sincerely,  Orlan Cramp, DO Allergy & Immunology  [facility]  [facility] office: [phone] [facility] office: [phone]
